# Patient Record
Sex: Male | Born: 1990 | Race: Black or African American | Hispanic: No | Marital: Single | Smoking: Former smoker
Health system: Southern US, Community
[De-identification: ages and names within clinical notes are randomized; demographics above are authoritative.]

## PROBLEM LIST (undated history)

## (undated) NOTE — *Deleted (*Deleted)
[facility] Progress Note   Subjective:   Had HD #2 yesterday.   Objective Vitals:   06/24/20 1807 06/24/20 1951 06/25/20 0502 06/25/20 0857  BP: (!) 166/101 (!) 158/106 (!) 173/98 (!) 140/94  Pulse: 90 87 83 78  Resp: 13 18 18 18   Temp: 98.4 F (36.9 C) 98.7 F (37.1 C) 99.3 F (37.4 C) 98.3 F (36.8 C)  TempSrc: Oral Oral Oral Oral  SpO2: 100% 100% 100% 98%  Weight: 74.1 kg 74.1 kg    Height:       Physical Exam General: comfortable Heart: RRR Lungs:clear Extremities: no edema Neuro: nonfocal Psych:  Normal mood and affect in circumstances Dialysis access:  RIJ TDC with large pressure dressing without e/o ongoing bleeding.  Additional Objective Labs: Basic Metabolic Panel: Recent Labs  Lab 06/21/20 1427 06/22/20 0052 06/23/20 0249 06/24/20 0603 06/25/20 0105  NA 142   < > 140 140 140  K 4.4   < > 3.7 3.4* 3.8  CL 110   < > 103 104 103  CO2 15*   < > 20* 22 28  GLUCOSE 80   < > 99 96 94  BUN 123*   < > 124* 73* 40*  CREATININE 22.42*   < > 22.61* 15.78* 11.35*  CALCIUM 6.3*  6.2   < > 6.4* 6.9* 7.1*  PHOS 10.3*  --   --   --  4.4   < > = values in this interval not displayed.   Liver Function Tests: Recent Labs  Lab 06/22/20 0052 06/22/20 0052 06/23/20 0249 06/24/20 0603 06/25/20 0105  AST 11*  --  13* 12*  --   ALT 15  --  17 13  --   ALKPHOS 28*  --  31* 28*  --   BILITOT 0.9  --  0.8 0.8  --   PROT 5.2*  --  5.8* 5.7*  --   ALBUMIN 3.3*   < > 3.5 3.5 3.1*   < > = values in this interval not displayed.   No results for input(s): LIPASE, AMYLASE in the last 168 hours. CBC: Recent Labs  Lab 06/21/20 1427 06/21/20 1427 06/22/20 0052 06/22/20 0052 06/24/20 0603 06/24/20 2218 06/25/20 0105  WBC 2.7*   < > 4.1   < > 4.2 3.5* 3.7*  NEUTROABS 1.4*  --  2.4  --  3.1  --   --   HGB 7.0*   < > 7.5*   < > 7.9* 7.3* 7.1*  HCT 20.9*   < > 22.0*   < > 23.9* 22.2* 21.5*  MCV 92.5  --  92.4  --  91.2 91.0 91.5  PLT 96*   < > 96*    < > 84* 53* 54*   < > = values in this interval not displayed.   Blood Culture No results found for: SDES, SPECREQUEST, CULT, REPTSTATUS  Cardiac Enzymes: No results for input(s): CKTOTAL, CKMB, CKMBINDEX, TROPONINI in the last 168 hours. CBG: No results for input(s): GLUCAP in the last 168 hours. Iron Studies:  No results for input(s): IRON, TIBC, TRANSFERRIN, FERRITIN in the last 72 hours. @lablastinr3 @ Studies/Results: IR Fluoro Guide CV Line Right  Result Date: 06/23/2020 INDICATION: 45 year old male with end-stage renal disease requiring dialysis. EXAM: TUNNELED PICC LINE WITH ULTRASOUND AND FLUOROSCOPIC GUIDANCE MEDICATIONS: Ancef IV. The antibiotic was given in an appropriate time interval prior to skin puncture. ANESTHESIA/SEDATION: Versed 2 mg IV; Fentanyl 75 mcg IV; Moderate Sedation Time:  23  minutes The patient was continuously monitored during the procedure by the interventional radiology nurse under my direct supervision. FLUOROSCOPY TIME:  Fluoroscopy Time: 0 minutes 42 seconds ( mGy). COMPLICATIONS: None immediate. PROCEDURE: Informed written consent was obtained from the patient after a discussion of the risks, benefits, and alternatives to treatment. Questions regarding the procedure were encouraged and answered. The right neck and chest were prepped with chlorhexidine in a sterile fashion, and a sterile drape was applied covering the operative field. Maximum barrier sterile technique with sterile gowns and gloves were used for the procedure. A timeout was performed prior to the initiation of the procedure. After creating a small venotomy incision, a micropuncture kit was utilized to access the right internal jugular vein under direct, real-time ultrasound guidance after the overlying soft tissues were anesthetized with 1% lidocaine with epinephrine. Ultrasound image documentation was performed. The microwire was kinked to measure appropriate catheter length. The micropuncture  sheath was exchanged for a peel-away sheath over a guidewire. A 14 French dual lumen tunneled dialysis catheter measuring 23 cm tip to cuff cm was tunneled in a retrograde fashion from the anterior chest wall to the venotomy incision. The catheter was then placed through the peel-away sheath with tip ultimately positioned at the superior caval-atrial junction. Final catheter positioning was confirmed and documented with a spot radiographic image. The catheter aspirates and flushes normally. The catheter was flushed with appropriate volume heparin dwells. The catheter exit site was secured with a 0-Prolene retention suture. The venotomy incision was closed with Dermabond. Dressings were applied. The patient tolerated the procedure well without immediate post procedural complication. FINDINGS: After catheter placement, the tip lies within the superior cavoatrial junction. The catheter aspirates and flushes normally and is ready for immediate use. IMPRESSION: Successful placement of 23 cm tip to cuffcm dual lumen tunneled dialysis catheter catheter via the right internal jugular vein with tip terminating at the superior caval atrial junction. The catheter is ready for immediate use. Electronically Signed   By: Constance Holster M.D.   On: 06/23/2020 18:48   IR US Guide Vasc Access Right  Result Date: 06/23/2020 INDICATION: 54 year old male with end-stage renal disease requiring dialysis. EXAM: TUNNELED PICC LINE WITH ULTRASOUND AND FLUOROSCOPIC GUIDANCE MEDICATIONS: Ancef IV. The antibiotic was given in an appropriate time interval prior to skin puncture. ANESTHESIA/SEDATION: Versed 2 mg IV; Fentanyl 75 mcg IV; Moderate Sedation Time:  23 minutes The patient was continuously monitored during the procedure by the interventional radiology nurse under my direct supervision. FLUOROSCOPY TIME:  Fluoroscopy Time: 0 minutes 42 seconds ( mGy). COMPLICATIONS: None immediate. PROCEDURE: Informed written consent was obtained  from the patient after a discussion of the risks, benefits, and alternatives to treatment. Questions regarding the procedure were encouraged and answered. The right neck and chest were prepped with chlorhexidine in a sterile fashion, and a sterile drape was applied covering the operative field. Maximum barrier sterile technique with sterile gowns and gloves were used for the procedure. A timeout was performed prior to the initiation of the procedure. After creating a small venotomy incision, a micropuncture kit was utilized to access the right internal jugular vein under direct, real-time ultrasound guidance after the overlying soft tissues were anesthetized with 1% lidocaine with epinephrine. Ultrasound image documentation was performed. The microwire was kinked to measure appropriate catheter length. The micropuncture sheath was exchanged for a peel-away sheath over a guidewire. A 14 French dual lumen tunneled dialysis catheter measuring 23 cm tip to cuff cm was tunneled in  a retrograde fashion from the anterior chest wall to the venotomy incision. The catheter was then placed through the peel-away sheath with tip ultimately positioned at the superior caval-atrial junction. Final catheter positioning was confirmed and documented with a spot radiographic image. The catheter aspirates and flushes normally. The catheter was flushed with appropriate volume heparin dwells. The catheter exit site was secured with a 0-Prolene retention suture. The venotomy incision was closed with Dermabond. Dressings were applied. The patient tolerated the procedure well without immediate post procedural complication. FINDINGS: After catheter placement, the tip lies within the superior cavoatrial junction. The catheter aspirates and flushes normally and is ready for immediate use. IMPRESSION: Successful placement of 23 cm tip to cuffcm dual lumen tunneled dialysis catheter catheter via the right internal jugular vein with tip  terminating at the superior caval atrial junction. The catheter is ready for immediate use. Electronically Signed   By: Constance Holster M.D.   On: 06/23/2020 18:48   Medications: . sodium chloride Stopped (06/21/20 0509)  . sodium chloride    . sodium chloride     . amLODipine  5 mg Oral Daily  . calcium carbonate  400 mg of elemental calcium Oral TID WC  . Chlorhexidine Gluconate Cloth  6 each Topical Q0600  . darbepoetin (ARANESP) injection - DIALYSIS  60 mcg Subcutaneous Q Sat-HD  . [START ON 07/01/2020] darbepoetin (ARANESP) injection - DIALYSIS  60 mcg Intravenous Q Sat-HD  . feeding supplement (NEPRO CARB STEADY)  237 mL Oral TID BM  . lidocaine (PF)      . loperamide  2 mg Oral Q8H  . sodium bicarbonate  650 mg Oral TID    Dialysis Orders:  Assessment/Plan: **ESRD:  I suspect this represents ESRD given labs and appearance of kidneys on [place] with diffuse echogenicity, small, somewhat atrophic appearance.  He has general serologic evaluation in process --> suspect it will be bland but will follow; less important now as this is likely ESRD but will be important for future transplant.  [place] placement 10/8 with HD #1 overnight, HD #2 today.  VVS appreciated - will place perm access, plan pending vein mapping. Receiving education re: ESRD, diet, etc.  We discussed transplant process this week.   CLIP underway.  **HTN:  Initiated amlodipine 5mg  daily.  Having some pain overnight so maintain current dose as I suspect HTN is pain related.  **Pancytopenia:  Anemia expected with CKD but not pancytopenia.  Iron ok, will initiate ESA. Follow but consider hematology input if not improving.  HIV neg.   **Metabolic acidosis: d/c po bicarb now that dialyzing.  **N/V/diarrhea:  Suspect mostly uremia; Gi pathogen panel neg.Marland Kitchen  Dispo: pending initiation of dialysis and arrangement of outpt HD.    Jannifer Hick MD 06/25/2020, 10:05 AM  [facility] Pager: [phone]

---

## 2020-06-20 ENCOUNTER — Inpatient Hospital Stay (HOSPITAL_COMMUNITY)
Admission: EM | Admit: 2020-06-20 | Discharge: 2020-06-29 | DRG: 673 | Disposition: A | Discharge status: Home / Self Care | Payer: Medicaid Other | Attending: Internal Medicine | Admitting: Internal Medicine

## 2020-06-20 ENCOUNTER — Other Ambulatory Visit: Payer: Self-pay

## 2020-06-20 ENCOUNTER — Encounter (HOSPITAL_COMMUNITY): Payer: Self-pay | Admitting: Emergency Medicine

## 2020-06-20 DIAGNOSIS — Z992 Dependence on renal dialysis: Secondary | ICD-10-CM

## 2020-06-20 DIAGNOSIS — D61818 Other pancytopenia: Secondary | ICD-10-CM

## 2020-06-20 DIAGNOSIS — D631 Anemia in chronic kidney disease: Secondary | ICD-10-CM | POA: Diagnosis present

## 2020-06-20 DIAGNOSIS — Z20822 Contact with and (suspected) exposure to covid-19: Secondary | ICD-10-CM | POA: Diagnosis present

## 2020-06-20 DIAGNOSIS — R432 Parageusia: Secondary | ICD-10-CM | POA: Diagnosis present

## 2020-06-20 DIAGNOSIS — N261 Atrophy of kidney (terminal): Secondary | ICD-10-CM | POA: Diagnosis present

## 2020-06-20 DIAGNOSIS — I120 Hypertensive chronic kidney disease with stage 5 chronic kidney disease or end stage renal disease: Secondary | ICD-10-CM | POA: Diagnosis present

## 2020-06-20 DIAGNOSIS — N19 Unspecified kidney failure: Secondary | ICD-10-CM

## 2020-06-20 DIAGNOSIS — E43 Unspecified severe protein-calorie malnutrition: Secondary | ICD-10-CM | POA: Insufficient documentation

## 2020-06-20 DIAGNOSIS — N2581 Secondary hyperparathyroidism of renal origin: Secondary | ICD-10-CM | POA: Diagnosis present

## 2020-06-20 DIAGNOSIS — E538 Deficiency of other specified B group vitamins: Secondary | ICD-10-CM | POA: Diagnosis present

## 2020-06-20 DIAGNOSIS — D649 Anemia, unspecified: Secondary | ICD-10-CM

## 2020-06-20 DIAGNOSIS — R197 Diarrhea, unspecified: Secondary | ICD-10-CM | POA: Diagnosis present

## 2020-06-20 DIAGNOSIS — E872 Acidosis, unspecified: Secondary | ICD-10-CM

## 2020-06-20 DIAGNOSIS — Z6820 Body mass index (BMI) 20.0-20.9, adult: Secondary | ICD-10-CM

## 2020-06-20 DIAGNOSIS — D696 Thrombocytopenia, unspecified: Secondary | ICD-10-CM

## 2020-06-20 DIAGNOSIS — F17210 Nicotine dependence, cigarettes, uncomplicated: Secondary | ICD-10-CM | POA: Diagnosis present

## 2020-06-20 DIAGNOSIS — N186 End stage renal disease: Secondary | ICD-10-CM

## 2020-06-20 DIAGNOSIS — N179 Acute kidney failure, unspecified: Principal | ICD-10-CM

## 2020-06-20 LAB — BASIC METABOLIC PANEL
Anion gap: 16 — ABNORMAL HIGH (ref 5–15)
BUN: 130 mg/dL — ABNORMAL HIGH (ref 6–20)
CO2: 13 mmol/L — ABNORMAL LOW (ref 22–32)
Calcium: 6.6 mg/dL — ABNORMAL LOW (ref 8.9–10.3)
Chloride: 110 mmol/L (ref 98–111)
Creatinine, Ser: 24.48 mg/dL — ABNORMAL HIGH (ref 0.61–1.24)
GFR calc non Af Amer: 2 mL/min — ABNORMAL LOW (ref >60)
Glucose, Bld: 125 mg/dL — ABNORMAL HIGH (ref 70–99)
Potassium: 4.7 mmol/L (ref 3.5–5.1)
Sodium: 139 mmol/L (ref 135–145)

## 2020-06-20 LAB — CBC
HCT: 17.5 % — ABNORMAL LOW (ref 39.0–52.0)
Hemoglobin: 5.6 g/dL — CL (ref 13.0–17.0)
MCH: 31.1 pg (ref 26.0–34.0)
MCHC: 32 g/dL (ref 30.0–36.0)
MCV: 97.2 fL (ref 80.0–100.0)
Platelets: 111 10*3/uL — ABNORMAL LOW (ref 150–400)
RBC: 1.8 MIL/uL — ABNORMAL LOW (ref 4.22–5.81)
RDW: 14.2 % (ref 11.5–15.5)
WBC: 3.3 10*3/uL — ABNORMAL LOW (ref 4.0–10.5)
nRBC: 0 % (ref 0.0–0.2)

## 2020-06-20 LAB — PREPARE RBC (CROSSMATCH)

## 2020-06-20 MED ORDER — SODIUM CHLORIDE 0.9 % IV SOLN: 10.0000 mL/h | Freq: Once | INTRAVENOUS | Status: DC

## 2020-06-20 MED ORDER — SODIUM CHLORIDE 0.9 % IV BOLUS
1000.0000 mL | Freq: Once | INTRAVENOUS | Status: AC
  Administered 2020-06-20: 1000 mL via INTRAVENOUS

## 2020-06-20 NOTE — ED Triage Notes (Addendum)
Pt presents to ED POV. Pt c/o loss of appetite and nausea. Pt expresses he is able to swallow food, just does not want to eat. Denies and other GI/GU complaints. NAD

## 2020-06-20 NOTE — ED Notes (Signed)
Pt reports having daily emesis episodes over 1 month ago, recently resolve. Emesis post fluid intake or coughing, pt reports daily nausea continues.

## 2020-06-20 NOTE — ED Provider Notes (Addendum)
[place] [facility] EMERGENCY DEPARTMENT Provider Note   CSN: [id number] Arrival date & time: 06/20/20  1900     History Chief Complaint  Patient presents with  . Anorexia    Brian Duffy. is a 29 y.o. male.  29 year old male presents with complaint of progressively worsening fatigue and loss of appetite over the past several months.  Patient states that he can tolerate small to normal drinks however if he drinks a large amount of water he will have vomiting.  He reports normal urinary output as well as normal bowel movements, denies any dark or bloody stools.  Patient does report unintentional weight loss, not quantified.  Patient does not have a PCP, has not been seen for this previously patient is an occasional smoker, does smoke marijuana as well.  Denies illicit drug use otherwise or IV drug abuse.  No significant health history, does not take any medications daily.        History reviewed. No pertinent past medical history.  Patient Active Problem List   Diagnosis Date Noted  . AKI (acute kidney injury) ([place]) 06/21/2020    History reviewed. No pertinent family history.  Social History   Tobacco Use  . Smoking status: Not on file  Substance Use Topics  . Alcohol use: Not on file  . Drug use: Not on file    Home Medications Prior to Admission medications   Not on File    Allergies    Patient has no known allergies.  Review of Systems   Review of Systems  Constitutional: Positive for appetite change, fatigue and unexpected weight change. Negative for fever.  Respiratory: Negative for shortness of breath.   Cardiovascular: Negative for chest pain.  Gastrointestinal: Negative for abdominal pain, blood in stool, constipation, diarrhea, nausea and vomiting.  Genitourinary: Negative for difficulty urinating, dysuria, frequency and urgency.  Musculoskeletal: Negative for back pain.  Skin: Negative for rash and wound.  Allergic/Immunologic: Negative  for immunocompromised state.  Neurological: Negative for dizziness, weakness and headaches.  Psychiatric/Behavioral: Negative for confusion.  All other systems reviewed and are negative.   Physical Exam Updated Vital Signs BP (!) 167/106 (BP Location: Right Arm)   Pulse 65   Temp 99 F (37.2 C) (Oral)   Resp 15   Ht 6\' 2"  (1.88 m)   Wt 74.8 kg   SpO2 100%   BMI 21.18 kg/m   Physical Exam Vitals and nursing note reviewed.  Constitutional:      General: He is not in acute distress.    Appearance: He is well-developed. He is not diaphoretic.  HENT:     Head: Normocephalic and atraumatic.  Cardiovascular:     Rate and Rhythm: Normal rate.     Pulses: Normal pulses.     Heart sounds: Normal heart sounds.  Pulmonary:     Effort: Pulmonary effort is normal.     Breath sounds: Normal breath sounds.  Abdominal:     Palpations: Abdomen is soft.     Tenderness: There is no abdominal tenderness.  Musculoskeletal:     Right lower leg: No edema.     Left lower leg: No edema.  Skin:    General: Skin is warm and dry.     Findings: No erythema or rash.  Neurological:     Mental Status: He is alert and oriented to person, place, and time.  Psychiatric:        Behavior: Behavior normal.     ED Results / Procedures /  Treatments   Labs (all labs ordered are listed, but only abnormal results are displayed) Labs Reviewed  BASIC METABOLIC PANEL - Abnormal; Notable for the following components:      Result Value   CO2 13 (*)    Glucose, Bld 125 (*)    BUN 130 (*)    Creatinine, Ser 24.48 (*)    Calcium 6.6 (*)    GFR calc non Af Amer 2 (*)    Anion gap 16 (*)    All other components within normal limits  CBC - Abnormal; Notable for the following components:   WBC 3.3 (*)    RBC 1.80 (*)    Hemoglobin 5.6 (*)    HCT 17.5 (*)    Platelets 111 (*)    All other components within normal limits  RESPIRATORY PANEL BY RT PCR (FLU A&B, COVID)  URINALYSIS, ROUTINE W REFLEX  MICROSCOPIC  I-STAT CHEM 8, ED  TYPE AND SCREEN  PREPARE RBC (CROSSMATCH)  ABO/RH    EKG EKG Interpretation  Date/Time:  Tuesday June 20 2020 22:34:26 EDT Ventricular Rate:  66 PR Interval:    QRS Duration: 102 QT Interval:  468 QTC Calculation: 491 R Axis:   60 Text Interpretation: Sinus rhythm Borderline T abnormalities, inferior leads Prolonged QT interval Confirmed by Dene Gentry [phone]) on 06/20/2020 10:42:14 PM   Radiology No results found.  Procedures .Critical Care Performed by: Tacy Learn, PA-C Authorized by: Tacy Learn, PA-C   Critical care provider statement:    Critical care time (minutes):  45   Critical care was time spent personally by me on the following activities:  Discussions with consultants, evaluation of patient's response to treatment, examination of patient, ordering and performing treatments and interventions, ordering and review of laboratory studies, ordering and review of radiographic studies, pulse oximetry, re-evaluation of patient's condition, obtaining history from patient or surrogate and review of old charts   (including critical care time)  Medications Ordered in ED Medications  0.9 %  sodium chloride infusion (has no administration in time range)  sodium bicarbonate 150 mEq in sterile water 1,000 mL infusion (has no administration in time range)  sodium chloride 0.9 % bolus 1,000 mL (0 mLs Intravenous Stopped 06/20/20 2337)    ED Course  I have reviewed the triage vital signs and the nursing notes.  Pertinent labs & imaging results that were available during my care of the patient were reviewed by me and considered in my medical decision making (see chart for details).  Clinical Course as of Jun 21 2  Tue Jun 20, 2020  2151 29 yo previously healthy male presents with complaint of gradual onset of fatigue and loss of appetite. On exam, well appearing thin male, abdomen is soft and non tender, respirations even and  unlabored.    [LM]  2153 Labs collected in triage with hgb 5.6, patient declines rectal exam at this time, declines blood transfusion at this time, states he needs more time to think about this. BMP with Cr 24 and BUN 130, Co2 12, gap 16. K normal at 4.7. Plan is to check I-stat to verify abnormal lab results, check type and screen, IV fluids and screening COVID.    [LM]  2310 I-stat confirms labs previously collected. Nephrology paged for consult, hospitalist paged for admission.    [LM]  2318 Case discussed with nephrology, Dr. Carolin Sicks, does not need dialysis tonight, will see in the morning. Requests renal US and may transfuse with 2 units  PRBC. Requests admit to medicine.   [LM]  2328 IV fluids discontinued, patient did not receive bolus.    [LM]  Wed Jun 21, 2020  0002 Sodium bicarb infusion ordered per hospitalist verbal order.   [LM]    Clinical Course User Index [LM] Roque Lias   MDM Rules/Calculators/A&P                          Final Clinical Impression(s) / ED Diagnoses Final diagnoses:  AKI (acute kidney injury) ([place])  Anemia, unspecified type    Rx / DC Orders ED Discharge Orders    None       Tacy Learn, PA-C 06/20/20 2333    Tacy Learn, PA-C 06/21/20 0003    Valarie Merino, MD 06/23/20 [phone]

## 2020-06-21 ENCOUNTER — Emergency Department (HOSPITAL_COMMUNITY): Payer: Medicaid Other

## 2020-06-21 ENCOUNTER — Other Ambulatory Visit: Payer: Self-pay

## 2020-06-21 ENCOUNTER — Encounter (HOSPITAL_COMMUNITY): Payer: Self-pay | Admitting: Internal Medicine

## 2020-06-21 DIAGNOSIS — E872 Acidosis, unspecified: Secondary | ICD-10-CM

## 2020-06-21 DIAGNOSIS — N2581 Secondary hyperparathyroidism of renal origin: Secondary | ICD-10-CM | POA: Diagnosis present

## 2020-06-21 DIAGNOSIS — N179 Acute kidney failure, unspecified: Secondary | ICD-10-CM | POA: Diagnosis present

## 2020-06-21 DIAGNOSIS — D631 Anemia in chronic kidney disease: Secondary | ICD-10-CM | POA: Diagnosis present

## 2020-06-21 DIAGNOSIS — E538 Deficiency of other specified B group vitamins: Secondary | ICD-10-CM | POA: Diagnosis present

## 2020-06-21 DIAGNOSIS — N261 Atrophy of kidney (terminal): Secondary | ICD-10-CM | POA: Diagnosis present

## 2020-06-21 DIAGNOSIS — D649 Anemia, unspecified: Secondary | ICD-10-CM

## 2020-06-21 DIAGNOSIS — Z20822 Contact with and (suspected) exposure to covid-19: Secondary | ICD-10-CM | POA: Diagnosis present

## 2020-06-21 DIAGNOSIS — N19 Unspecified kidney failure: Secondary | ICD-10-CM

## 2020-06-21 DIAGNOSIS — Z992 Dependence on renal dialysis: Secondary | ICD-10-CM | POA: Diagnosis not present

## 2020-06-21 DIAGNOSIS — I120 Hypertensive chronic kidney disease with stage 5 chronic kidney disease or end stage renal disease: Secondary | ICD-10-CM | POA: Diagnosis present

## 2020-06-21 DIAGNOSIS — E43 Unspecified severe protein-calorie malnutrition: Secondary | ICD-10-CM | POA: Diagnosis present

## 2020-06-21 DIAGNOSIS — F17210 Nicotine dependence, cigarettes, uncomplicated: Secondary | ICD-10-CM | POA: Diagnosis present

## 2020-06-21 DIAGNOSIS — D61818 Other pancytopenia: Secondary | ICD-10-CM | POA: Diagnosis present

## 2020-06-21 DIAGNOSIS — D696 Thrombocytopenia, unspecified: Secondary | ICD-10-CM

## 2020-06-21 DIAGNOSIS — N186 End stage renal disease: Secondary | ICD-10-CM | POA: Diagnosis present

## 2020-06-21 DIAGNOSIS — Z6820 Body mass index (BMI) 20.0-20.9, adult: Secondary | ICD-10-CM | POA: Diagnosis not present

## 2020-06-21 DIAGNOSIS — R432 Parageusia: Secondary | ICD-10-CM | POA: Diagnosis present

## 2020-06-21 DIAGNOSIS — R197 Diarrhea, unspecified: Secondary | ICD-10-CM | POA: Diagnosis present

## 2020-06-21 LAB — URINALYSIS, ROUTINE W REFLEX MICROSCOPIC
Bacteria, UA: NONE SEEN
Bilirubin Urine: NEGATIVE
Glucose, UA: 50 mg/dL — AB
Ketones, ur: NEGATIVE mg/dL
Leukocytes,Ua: NEGATIVE
Nitrite: NEGATIVE
Protein, ur: 100 mg/dL — AB
Specific Gravity, Urine: 1.008 (ref 1.005–1.030)
pH: 6 (ref 5.0–8.0)

## 2020-06-21 LAB — PROTEIN / CREATININE RATIO, URINE
Creatinine, Urine: 85.97 mg/dL
Protein Creatinine Ratio: 1.3 mg/mg{Cre} — ABNORMAL HIGH (ref 0.00–0.15)
Total Protein, Urine: 112 mg/dL

## 2020-06-21 LAB — CBC WITH DIFFERENTIAL/PLATELET
Abs Immature Granulocytes: 0 10*3/uL (ref 0.00–0.07)
Basophils Absolute: 0 10*3/uL (ref 0.0–0.1)
Basophils Relative: 1 %
Eosinophils Absolute: 0.3 10*3/uL (ref 0.0–0.5)
Eosinophils Relative: 12 %
HCT: 20.9 % — ABNORMAL LOW (ref 39.0–52.0)
Hemoglobin: 7 g/dL — ABNORMAL LOW (ref 13.0–17.0)
Immature Granulocytes: 0 %
Lymphocytes Relative: 29 %
Lymphs Abs: 0.8 10*3/uL (ref 0.7–4.0)
MCH: 31 pg (ref 26.0–34.0)
MCHC: 33.5 g/dL (ref 30.0–36.0)
MCV: 92.5 fL (ref 80.0–100.0)
Monocytes Absolute: 0.2 10*3/uL (ref 0.1–1.0)
Monocytes Relative: 6 %
Neutro Abs: 1.4 10*3/uL — ABNORMAL LOW (ref 1.7–7.7)
Neutrophils Relative %: 52 %
Platelets: 96 10*3/uL — ABNORMAL LOW (ref 150–400)
RBC: 2.26 MIL/uL — ABNORMAL LOW (ref 4.22–5.81)
RDW: 14.7 % (ref 11.5–15.5)
WBC: 2.7 10*3/uL — ABNORMAL LOW (ref 4.0–10.5)
nRBC: 0 % (ref 0.0–0.2)

## 2020-06-21 LAB — SODIUM, URINE, RANDOM: Sodium, Ur: 75 mmol/L

## 2020-06-21 LAB — FOLATE: Folate: 6 ng/mL (ref >5.9)

## 2020-06-21 LAB — HEPATITIS C ANTIBODY: HCV Ab: NONREACTIVE

## 2020-06-21 LAB — IRON AND TIBC
Iron: 131 ug/dL (ref 45–182)
Saturation Ratios: 74 % — ABNORMAL HIGH (ref 17.9–39.5)
TIBC: 176 ug/dL — ABNORMAL LOW (ref 250–450)
UIBC: 45 ug/dL

## 2020-06-21 LAB — RENAL FUNCTION PANEL
Albumin: 3.2 g/dL — ABNORMAL LOW (ref 3.5–5.0)
Anion gap: 17 — ABNORMAL HIGH (ref 5–15)
BUN: 123 mg/dL — ABNORMAL HIGH (ref 6–20)
CO2: 15 mmol/L — ABNORMAL LOW (ref 22–32)
Calcium: 6.3 mg/dL — CL (ref 8.9–10.3)
Chloride: 110 mmol/L (ref 98–111)
Creatinine, Ser: 22.42 mg/dL — ABNORMAL HIGH (ref 0.61–1.24)
GFR calc non Af Amer: 2 mL/min — ABNORMAL LOW (ref >60)
Glucose, Bld: 80 mg/dL (ref 70–99)
Phosphorus: 10.3 mg/dL — ABNORMAL HIGH (ref 2.5–4.6)
Potassium: 4.4 mmol/L (ref 3.5–5.1)
Sodium: 142 mmol/L (ref 135–145)

## 2020-06-21 LAB — RETICULOCYTES
Immature Retic Fract: 4 % (ref 2.3–15.9)
RBC.: 2.26 MIL/uL — ABNORMAL LOW (ref 4.22–5.81)
Retic Count, Absolute: 24 10*3/uL (ref 19.0–186.0)
Retic Ct Pct: 1.1 % (ref 0.4–3.1)

## 2020-06-21 LAB — RESPIRATORY PANEL BY RT PCR (FLU A&B, COVID)
Influenza A by PCR: NEGATIVE
Influenza B by PCR: NEGATIVE
SARS Coronavirus 2 by RT PCR: NEGATIVE

## 2020-06-21 LAB — CREATININE, URINE, RANDOM: Creatinine, Urine: 87.18 mg/dL

## 2020-06-21 LAB — ABO/RH: ABO/RH(D): A POS

## 2020-06-21 LAB — VITAMIN B12: Vitamin B-12: 277 pg/mL (ref 180–914)

## 2020-06-21 LAB — MAGNESIUM: Magnesium: 1.6 mg/dL — ABNORMAL LOW (ref 1.7–2.4)

## 2020-06-21 LAB — HEPATITIS B SURFACE ANTIGEN: Hepatitis B Surface Ag: NONREACTIVE

## 2020-06-21 LAB — FERRITIN: Ferritin: 222 ng/mL (ref 24–336)

## 2020-06-21 LAB — HIV ANTIBODY (ROUTINE TESTING W REFLEX): HIV Screen 4th Generation wRfx: NONREACTIVE

## 2020-06-21 MED ORDER — STERILE WATER FOR INJECTION IV SOLN
INTRAVENOUS | Status: DC
  Filled 2020-06-21 (×3): qty 150
  Filled 2020-06-21 (×4): qty 850

## 2020-06-21 MED ORDER — CALCIUM GLUCONATE-NACL 1-0.675 GM/50ML-% IV SOLN
1.0000 g | Freq: Once | INTRAVENOUS | Status: AC
  Administered 2020-06-21: 1000 mg via INTRAVENOUS
  Filled 2020-06-21: qty 50

## 2020-06-21 MED ORDER — HEPARIN SODIUM (PORCINE) 5000 UNIT/ML IJ SOLN
5000.0000 [IU] | Freq: Three times a day (TID) | INTRAMUSCULAR | Status: DC
  Administered 2020-06-21 – 2020-06-22 (×5): 5000 [IU] via SUBCUTANEOUS
  Filled 2020-06-21 (×4): qty 1

## 2020-06-21 NOTE — Progress Notes (Signed)
CRITICAL VALUE ALERT  Critical Value: Calcium: 6.3  Date & Time Notied: 06/21/20 1600  Provider Notified: Verlon Au, MD   Orders Received/Actions taken: Orders placed for Calcium Gluconate 1 G IVPB.

## 2020-06-21 NOTE — Plan of Care (Signed)
  Problem: Health Behavior/Discharge Planning: Goal: Ability to manage health-related needs will improve Outcome: Completed/Met   Problem: Nutrition: Goal: Adequate nutrition will be maintained Outcome: Completed/Met   Problem: Pain Managment: Goal: General experience of comfort will improve Outcome: Completed/Met

## 2020-06-21 NOTE — Progress Notes (Signed)
Patient seen and examined and agree with plan of care as per my partner who admitted this patient earlier this morning He feels somewhat better-he is seen nephrologist and understands that his kidneys are in trouble He tells me he has no inadvertent ingestions and has not taken any over-the-counter's for his diarrhea He is passing reasonable urine he is not hungry at all however  P Repeat labs in the morning Follow-up nephrology work-up-I am hopeful for renal recovery however given this degree of azotemia unclear if he was ever returned to baseline He may require if work-up is negative for renal biopsy but it may not change the need for dialysis  No charge  Verneita Griffes, MD [facility] 2:51 PM

## 2020-06-21 NOTE — H&P (Addendum)
History and Physical    Brian Duffy. FIE:[id number] DOB: 1991/02/04 DOA: 06/20/2020  PCP: Patient, [name] Patient coming from: Home  Chief Complaint: Loss of appetite  HPI: Brian Duffy. is a 29 y.o. male with no significant past medical history presenting with a chief complaint of loss of appetite. Patient reports having loss of appetite, nausea, vomiting, and fatigue for several months. States he does not feel like eating and when he tries to eat he starts feeling nauseous. He has not vomited this past week. Also experiences abdominal cramps sometimes. He is also having diarrhea. States he is urinating more than usual. Sometimes he feels short of breath. Denies history of kidney disease or any other medical problems. He does not take any medications. Denies family history of kidney disease. Denies over-the-counter NSAID use or any recent antibiotic use. He smokes 3 to 4 cigarettes a day and smokes marijuana.  ED Course: Blood pressure elevated on arrival, subsequently improved.  Remainder of vital signs stable.  WBC 3.3, hemoglobin 5.6, hematocrit 17.5, MCV 97.2, platelet 111K.  No prior labs for comparison.  FOBT could not be done as patient declined rectal exam.  Sodium 139, potassium 4.7, chloride 110, bicarb 13, BUN 130, creatinine 24.4, glucose 125, calcium 6.6, anion gap 16.  SARS-CoV-2 PCR test negative.  Influenza panel negative.  Renal ultrasound showing diffusely increased parenchymal echogenicity with several anechoic cysts; findings felt to be due to medical renal disease with possible polycystic kidneys.  Review of Systems:  All systems reviewed and apart from history of presenting illness, are negative.  History reviewed. No pertinent past medical history.  History reviewed. No pertinent surgical history.   reports that he has been smoking cigarettes. He has never used smokeless tobacco. He reports previous alcohol use. He reports current drug use. Drug: Marijuana.  No  Known Allergies  History reviewed. No pertinent family history.  Prior to Admission medications   Not on File    Physical Exam: Vitals:   06/21/20 0212 06/21/20 0227 06/21/20 0230 06/21/20 0309  BP: (!) 148/95 (!) 146/94 (!) 146/94 (!) 162/106  Pulse: 76 75 86 72  Resp: 16 14 15 14   Temp: 98.9 F (37.2 C) 98.9 F (37.2 C)    TempSrc: Oral Oral    SpO2:  100% 100% 100%  Weight:      Height:        Physical Exam Constitutional:      General: He is not in acute distress. HENT:     Head: Normocephalic and atraumatic.  Eyes:     Extraocular Movements: Extraocular movements intact.     Conjunctiva/sclera: Conjunctivae normal.  Cardiovascular:     Rate and Rhythm: Normal rate and regular rhythm.     Pulses: Normal pulses.  Pulmonary:     Effort: Pulmonary effort is normal. No respiratory distress.     Breath sounds: Normal breath sounds. No wheezing or rales.  Abdominal:     General: Bowel sounds are normal. There is no distension.     Palpations: Abdomen is soft.     Tenderness: There is no abdominal tenderness. There is no guarding or rebound.  Musculoskeletal:        General: No tenderness.     Cervical back: Normal range of motion and neck supple.     Comments: Trace pedal edema  Skin:    General: Skin is warm and dry.  Neurological:     General: No focal deficit present.  Mental Status: He is alert and oriented to person, place, and time.     Labs on Admission: I have personally reviewed following labs and imaging studies  CBC: Recent Labs  Lab 06/20/20 2000  WBC 3.3*  HGB 5.6*  HCT 17.5*  MCV 97.2  PLT 371*   Basic Metabolic Panel: Recent Labs  Lab 06/20/20 2000  NA 139  K 4.7  CL 110  CO2 13*  GLUCOSE 125*  BUN 130*  CREATININE 24.48*  CALCIUM 6.6*   GFR: Estimated Creatinine Clearance: 4.8 mL/min (A) (by C-G formula based on SCr of 24.48 mg/dL (H)). Liver Function Tests: No results for input(s): AST, ALT, ALKPHOS, BILITOT, PROT,  ALBUMIN in the last 168 hours. No results for input(s): LIPASE, AMYLASE in the last 168 hours. No results for input(s): AMMONIA in the last 168 hours. Coagulation Profile: No results for input(s): INR, PROTIME in the last 168 hours. Cardiac Enzymes: No results for input(s): CKTOTAL, CKMB, CKMBINDEX, TROPONINI in the last 168 hours. BNP (last 3 results) No results for input(s): PROBNP in the last 8760 hours. HbA1C: No results for input(s): HGBA1C in the last 72 hours. CBG: No results for input(s): GLUCAP in the last 168 hours. Lipid Profile: No results for input(s): CHOL, HDL, LDLCALC, TRIG, CHOLHDL, LDLDIRECT in the last 72 hours. Thyroid Function Tests: No results for input(s): TSH, T4TOTAL, FREET4, T3FREE, THYROIDAB in the last 72 hours. Anemia Panel: No results for input(s): VITAMINB12, FOLATE, FERRITIN, TIBC, IRON, RETICCTPCT in the last 72 hours. Urine analysis:    Component Value Date/Time   COLORURINE STRAW (A) 06/20/2020 2341   APPEARANCEUR CLEAR 06/20/2020 2341   LABSPEC 1.008 06/20/2020 2341   PHURINE 6.0 06/20/2020 2341   GLUCOSEU 50 (A) 06/20/2020 2341   HGBUR SMALL (A) 06/20/2020 2341   BILIRUBINUR NEGATIVE 06/20/2020 2341   KETONESUR NEGATIVE 06/20/2020 2341   PROTEINUR 100 (A) 06/20/2020 2341   NITRITE NEGATIVE 06/20/2020 2341   LEUKOCYTESUR NEGATIVE 06/20/2020 2341    Radiological Exams on Admission: US Renal  Result Date: 06/21/2020 CLINICAL DATA:  Acute kidney injury EXAM: RENAL / URINARY TRACT ULTRASOUND COMPLETE COMPARISON:  None. FINDINGS: Right Kidney: Renal measurements: 8.7 x 3.6 x 4.7 cm = volume: 77 mL. There is diffusely increased echogenicity with atrophic appearance. Several anechoic cysts are seen within the right kidney the largest measuring 2.8 x 2.1 x 2.6 cm. Left Kidney: Renal measurements: 8.8 x 5.7 x 5.3 cm = volume: 140 mL. Diffusely increased echogenicity with a slightly atrophic appearance. Several anechoic cysts are seen within the left  kidney measuring 3.2 x 2.0 x 2.8 cm Bladder: Appears normal for degree of bladder distention. Other: None. IMPRESSION: Diffusely increased parenchymal echogenicity with several anechoic cyst. This could be due to medical renal disease with possible polycystic kidneys. Electronically Signed   By: Prudencio Pair M.D.   On: 06/21/2020 00:45    EKG: Independently reviewed.  Sinus rhythm, nonspecific T wave abnormality, QTC 491.  No prior tracing for comparison.  Assessment/Plan Principal Problem:   AKI (acute kidney injury) ([place]) Active Problems:   Uremia   Metabolic acidosis   Normocytic anemia   Thrombocytopenia ([facility])   AKI Uremia Metabolic acidosis Patient is presenting with complaints of loss of appetite, nausea, and fatigue over the past several months.  No documented history of any medical problems.  Not on any nephrotoxic medications.  Symptoms likely related to uremia.  BUN 130, creatinine 24.4, GFR 2.  Potassium 4.7.  Bicarb 13.  No prior  labs for comparison.  Patient reports adequate urine output, not oliguric. He has trace pedal edema on exam but lungs clear and no signs of respiratory distress. Renal ultrasound without findings to indicate obstructive uropathy.  Showing diffusely increased parenchymal echogenicity with several anechoic cysts; findings felt to be due to medical renal disease with possible polycystic kidneys.  UA showing pH 6.0, specific gravity 1.008, small amount of proteinuria. -Discussed with nephrology.  No plan for urgent dialysis at this time.  Recommending IV fluid hydration and routine AKI work-up. Bicarb infusion ordered.  Monitor renal function and urine output very closely.  Repeat renal function panel in a.m. Do not give any nephrotoxic agents/contrast.  Check urine sodium, creatinine.  Normocytic anemia: Hemoglobin 5.6, MCV 97.2.  No prior labs for comparison.  FOBT could not be done as patient declined rectal exam. Not endorsing any symptoms of GI bleed. Anemia  likely related to renal disease. -2 units PRBCs ordered in the ED, posttransfusion H&H, check anemia panel  Thrombocytopenia: Platelet count 111K.  No prior labs for comparison.  Likely related to renal disease.  No signs of active bleeding. -Continue to monitor  ?Hypocalcemia: Calcium 6.6. -Check albumin level  Leukopenia: WBC count 3.3.  No prior labs for comparison. -Repeat CBC with differential  Diarrhea: No fever or leukocytosis. Abdominal exam benign. -GI pathogen panel, enteric precautions  Borderline QT prolongation on EKG -Cardiac monitoring.  Monitor potassium and magnesium levels.  Repeat EKG in a.m.  Tobacco use -Patient declined nicotine patch  HIV screening The patient falls between the ages of 13-64 and should be screened for HIV, therefore HIV testing ordered.  DVT prophylaxis: Subcutaneous heparin Code Status: Full code Family Communication: No family available this time. Disposition Plan: Status is: Inpatient  Remains inpatient appropriate because:IV treatments appropriate due to intensity of illness or inability to take PO and Inpatient level of care appropriate due to severity of illness   Dispo: The patient is from: Home              Anticipated d/c is to: Home              Anticipated d/c date is: 3 days              Patient currently is not medically stable to d/c.  The medical decision making on this patient was of high complexity and the patient is at high risk for clinical deterioration, therefore this is a level 3 visit.  Shela Leff MD [facility]  If 7PM-7AM, please contact night-coverage [email or web address]  06/21/2020, 3:11 AM

## 2020-06-21 NOTE — Progress Notes (Signed)
PROGRESS NOTE    Brian Duffy.  BPZ:[id number] DOB: [birth year] DOA: 06/20/2020 PCP: Patient, [name]  Brief Narrative:  29 year old male with no past medical history other than severe loss of appetite nausea came to emergency room Found to have severe azotemia/creatinine 130/24 CO2 13 gap 16 Hemoglobin 5.6 WBC 3 platelet 111 Transfused 2 units PRBC  Work-up performed by renal includes   [-] hep C hep B, ANA, GBM membrane  Assessment & Plan:   Principal Problem:   AKI (acute kidney injury) ([place]) Active Problems:   Uremia   Metabolic acidosis   Normocytic anemia   Thrombocytopenia ([place])   1. Severe azotemia with metabolic acidosis probably from renal failure a. Patient has nausea and vomiting probably from azotemia b. He has been counseled quite in detail by Dr. Johnney Ou who is recommended possible initiation of dialysis and he is contemplating this c. Bicarb has been changed to p.o. replacement d. Appreciate nephrology input 2. Severe anemia probably secondary to impending ESRD a. Hemoccult never obtained but unlikely to be GI bleed as he is not having any actual diarrhea b. Iron stores are actually paradoxically high 3. Nausea vomiting anorexia a. From renal issues 4. BMI 21  DVT prophylaxis: Lovenox Code Status: Full presumed Family Communication: None at bedside Disposition:   Status is: Inpatient  Remains inpatient appropriate because:Hemodynamically unstable, Persistent severe electrolyte disturbances, Unsafe d/c plan and IV treatments appropriate due to intensity of illness or inability to take PO   Dispo: The patient is from: Home              Anticipated d/c is to: Home              Anticipated d/c date is: > 3 days              Patient currently is not medically stable to d/c.       Consultants:   Nephrology  Procedures: None  Antimicrobials: None   Subjective: Nausea vomiting persisting no new issues otherwise was going to leave AMA but  seems to reconsider this after discussion with myself and nephrologist  Objective: Vitals:   06/21/20 0227 06/21/20 0230 06/21/20 0309 06/21/20 0347  BP: (!) 146/94 (!) 146/94 (!) 162/106 (!) 171/113  Pulse: 75 86 72 70  Resp: 14 15 14 20   Temp: 98.9 F (37.2 C)   98.7 F (37.1 C)  TempSrc: Oral     SpO2: 100% 100% 100% 100%  Weight:      Height:        Intake/Output Summary (Last 24 hours) at 06/21/2020 0725 Last data filed at 06/21/2020 0600 Gross per 24 hour  Intake 756.67 ml  Output 0 ml  Net 756.67 ml   Filed Weights   06/20/20 2302  Weight: 74.8 kg    Examination:  General exam: EOMI NCAT looks about stated age no distress Respiratory system: Clear no added sound rales rhonchi Cardiovascular system: S1-S2 no murmur rub or gallop Gastrointestinal system: Soft nontender no rebound no guarding. Central nervous system: Neurologically intact no focal deficit Extremities: No lower extremity edema ROM intact to joints Skin: No edema Psychiatry: Euthymic  Data Reviewed: I have personally reviewed following labs and imaging studies BUNs/creatinine stable 124/22 Anion gap 18 Bicarb 15 Hemoglobin 7.5 Platelet 96  Radiology Studies: US Renal  Result Date: 06/21/2020 CLINICAL DATA:  Acute kidney injury EXAM: RENAL / URINARY TRACT ULTRASOUND COMPLETE COMPARISON:  None. FINDINGS: Right Kidney: Renal measurements: 8.7 x 3.6 x  4.7 cm = volume: 77 mL. There is diffusely increased echogenicity with atrophic appearance. Several anechoic cysts are seen within the right kidney the largest measuring 2.8 x 2.1 x 2.6 cm. Left Kidney: Renal measurements: 8.8 x 5.7 x 5.3 cm = volume: 140 mL. Diffusely increased echogenicity with a slightly atrophic appearance. Several anechoic cysts are seen within the left kidney measuring 3.2 x 2.0 x 2.8 cm Bladder: Appears normal for degree of bladder distention. Other: None. IMPRESSION: Diffusely increased parenchymal echogenicity with several anechoic  cyst. This could be due to medical renal disease with possible polycystic kidneys. Electronically Signed   By: Prudencio Pair M.D.   On: 06/21/2020 00:45     Scheduled Meds:  heparin  5,000 Units Subcutaneous Q8H   Continuous Infusions:  sodium chloride Stopped (06/21/20 0509)    sodium bicarbonate (isotonic) infusion in sterile water 125 mL/hr at 06/21/20 0504     LOS: 0 days    Time spent: 20  Nita Sells, MD [facility] To contact the attending provider between 7A-7P or the covering provider during after hours 7P-7A, please log into the web site [email or web address] and access using universal [place] password for that web site. If you do not have the password, please call the hospital operator.  06/21/2020, 7:25 AM

## 2020-06-21 NOTE — Consult Note (Signed)
[facility] Nephrology Consultation Note  Requesting MD: Dr Shela Leff Reason for consult: Renal failure  HPI:  Brian Duffy. is a 29 y.o. male no known past medical history presented with nausea, loss of appetite and weakness ongoing for more than a month seen as a consultation for further evaluation of elevated serum BUN and creatinine level.  The labs showed BUN 130, creatinine 24.48, CO2 13, potassium 4.7.  No previous lab results available to compare.  He also has pancytopenia with hemoglobin 5.6, platelet 111.  Patient reports chronic ongoing nausea associated with diarrhea and occasional abdominal cramp.  He does not take any prescription medication or OTC.  No use of NSAIDs.  No history of diabetes, hypertension or heart disease.  Smokes cigarettes and occasional marijuana. He feels like he was urinating more often.  Denies dysuria, urgency, frequency, hematuria, frothy urine, pelvic or flank pain.  No joint pain or skin rash. Influenza and Covid test negative.  He received 2 units of blood transfusion.  Blood pressure was initially elevated but fluctuating.  Oxygenation is acceptable.  The urinalysis showed clear urine with protein 100, no hematuria.  The kidney ultrasound revealed bilateral echogenic atrophied kidneys and several cysts without obstruction. He denies family history of kidney disease.  Not working.  Staying with his girlfriend. He is currently on sodium bicarbonate IV fluid.   Creatinine, Ser  Date/Time Value Ref Range Status  06/20/2020 08:00 PM 24.48 (H) 0.61 - 1.24 mg/dL Final     PMHx:  History reviewed. No pertinent past medical history.  History reviewed. No pertinent surgical history.  Family Hx: History reviewed. No pertinent family history.  Social History:  reports that he has been smoking cigarettes. He has never used smokeless tobacco. He reports previous alcohol use. He reports current drug use. Drug: Marijuana.  Allergies:  No Known Allergies  Medications: Prior to Admission medications   Not on File    I have reviewed the patient's current medications.  Labs:  Results for orders placed or performed during the hospital encounter of 06/20/20 (from the past 48 hour(s))  Basic metabolic panel     Status: Abnormal   Collection Time: 06/20/20  8:00 PM  Result Value Ref Range   Sodium 139 135 - 145 mmol/L   Potassium 4.7 3.5 - 5.1 mmol/L   Chloride 110 98 - 111 mmol/L   CO2 13 (L) 22 - 32 mmol/L   Glucose, Bld 125 (H) 70 - 99 mg/dL    Comment: Glucose reference range applies only to samples taken after fasting for at least 8 hours.   BUN 130 (H) 6 - 20 mg/dL   Creatinine, Ser 24.48 (H) 0.61 - 1.24 mg/dL   Calcium 6.6 (L) 8.9 - 10.3 mg/dL   GFR calc non Af Amer 2 (L) >60 mL/min   Anion gap 16 (H) 5 - 15    Comment: Performed at [facility] [address], [place] [address]  CBC     Status: Abnormal   Collection Time: 06/20/20  8:00 PM  Result Value Ref Range   WBC 3.3 (L) 4.0 - 10.5 K/uL   RBC 1.80 (L) 4.22 - 5.81 MIL/uL   Hemoglobin 5.6 (LL) 13.0 - 17.0 g/dL    Comment: REPEATED TO VERIFY THIS CRITICAL RESULT HAS VERIFIED AND BEEN CALLED TO RN MELANIE FLORES BY [place] ON 10 05 2021 AT 2053, AND HAS BEEN READ BACK.     HCT 17.5 (L) 39 -  52 %   MCV 97.2 80.0 - 100.0 fL   MCH 31.1 26.0 - 34.0 pg   MCHC 32.0 30.0 - 36.0 g/dL   RDW 14.2 11.5 - 15.5 %   Platelets 111 (L) 150 - 400 K/uL    Comment: REPEATED TO VERIFY PLATELET COUNT CONFIRMED BY SMEAR SPECIMEN CHECKED FOR CLOTS Immature Platelet Fraction may be clinically indicated, consider ordering this additional test [id number]    nRBC 0.0 0.0 - 0.2 %    Comment: Performed at [facility], [place] [address], [place], [place] [address]  ABO/Rh     Status: None   Collection Time: 06/20/20  8:00 PM  Result Value Ref Range   ABO/RH(D)      A POS Performed at [facility], [place] [address], [place], [place]  [address]   Type and screen [place]     Status: None (Preliminary result)   Collection Time: 06/20/20 10:50 PM  Result Value Ref Range   ABO/RH(D) A POS    Antibody Screen NEG    Sample Expiration 06/23/2020,2359    Unit Number [id number]    Blood Component Type RED CELLS,LR    Unit division 00    Status of Unit ALLOCATED    Transfusion Status OK TO TRANSFUSE    Crossmatch Result Compatible    Unit Number [id number]    Blood Component Type RED CELLS,LR    Unit division 00    Status of Unit ISSUED    Transfusion Status OK TO TRANSFUSE    Crossmatch Result      Compatible Performed at [facility], [place] [address] [place], [place] [address]   Respiratory Panel by RT PCR (Flu A&B, Covid) - Nasopharyngeal Swab     Status: None   Collection Time: 06/20/20 11:03 PM   Specimen: Nasopharyngeal Swab  Result Value Ref Range   SARS Coronavirus 2 by RT PCR NEGATIVE NEGATIVE    Comment: (NOTE) SARS-CoV-2 target nucleic acids are NOT DETECTED.  The SARS-CoV-2 RNA is generally detectable in upper respiratoy specimens during the acute phase of infection. The lowest concentration of SARS-CoV-2 viral copies this assay can detect is 131 copies/mL. A negative result does not preclude SARS-Cov-2 infection and should not be used as the sole basis for treatment or other patient management decisions. A negative result may occur with  improper specimen collection/handling, submission of specimen other than nasopharyngeal swab, presence of viral mutation(s) within the areas targeted by this assay, and inadequate number of viral copies (<131 copies/mL). A negative result must be combined with clinical observations, patient history, and epidemiological information. The expected result is Negative.  Fact Sheet for Patients:  PinkCheek.be  Fact Sheet for Healthcare Providers:  GravelBags.it  This test is no t  yet approved or cleared by the [place] FDA and  has been authorized for detection and/or diagnosis of SARS-CoV-2 by FDA under an Emergency Use Authorization (EUA). This EUA will remain  in effect (meaning this test can be used) for the duration of the COVID-19 declaration under Section 564(b)(1) of the Act, 21 U.S.C. section 360bbb-3(b)(1), unless the authorization is terminated or revoked sooner.     Influenza A by PCR NEGATIVE NEGATIVE   Influenza B by PCR NEGATIVE NEGATIVE    Comment: (NOTE) The Xpert Xpress SARS-CoV-2/FLU/RSV assay is intended as an aid in  the diagnosis of influenza from Nasopharyngeal swab specimens and  should not be used as a sole basis for treatment.  Nasal washings and  aspirates are unacceptable for Xpert Xpress SARS-CoV-2/FLU/RSV  testing.  Fact Sheet for Patients: PinkCheek.be  Fact Sheet for Healthcare Providers: GravelBags.it  This test is not yet approved or cleared by the [place] FDA and  has been authorized for detection and/or diagnosis of SARS-CoV-2 by  FDA under an Emergency Use Authorization (EUA). This EUA will remain  in effect (meaning this test can be used) for the duration of the  Covid-19 declaration under Section 564(b)(1) of the Act, 21  U.S.C. section 360bbb-3(b)(1), unless the authorization is  terminated or revoked. Performed at [facility], [place] [address] [place], [place] [address]   Prepare RBC (crossmatch)     Status: None   Collection Time: 06/20/20 11:24 PM  Result Value Ref Range   Order Confirmation      ORDER PROCESSED BY BLOOD BANK Performed at [facility], [place] [address], [place], [place] [address]   Urinalysis, Routine w reflex microscopic Urine, Clean Catch     Status: Abnormal   Collection Time: 06/20/20 11:41 PM  Result Value Ref Range   Color, Urine STRAW (A) YELLOW   APPearance CLEAR CLEAR   Specific Gravity, Urine 1.008  1.005 - 1.030   pH 6.0 5.0 - 8.0   Glucose, UA 50 (A) NEGATIVE mg/dL   Hgb urine dipstick SMALL (A) NEGATIVE   Bilirubin Urine NEGATIVE NEGATIVE   Ketones, ur NEGATIVE NEGATIVE mg/dL   Protein, ur 100 (A) NEGATIVE mg/dL   Nitrite NEGATIVE NEGATIVE   Leukocytes,Ua NEGATIVE NEGATIVE   RBC / HPF 0-5 0 - 5 RBC/hpf   WBC, UA 0-5 0 - 5 WBC/hpf   Bacteria, UA NONE SEEN NONE SEEN   Mucus PRESENT     Comment: Performed at [facility] [address], [place], [place] [address]     ROS:  Pertinent items noted in HPI and remainder of comprehensive ROS otherwise negative.  Physical Exam: Vitals:   06/21/20 0309 06/21/20 0347  BP: (!) 162/106 (!) 171/113  Pulse: 72 70  Resp: 14 20  Temp:  98.7 F (37.1 C)  SpO2: 100% 100%     General exam: Appears calm and comfortable  Respiratory system: Clear to auscultation. Respiratory effort normal. No wheezing or crackle Cardiovascular system: S1 & S2 heard, RRR.  No pedal edema. Gastrointestinal system: Abdomen is nondistended, soft and nontender. Normal bowel sounds heard. Central nervous system: Alert and oriented. No focal neurological deficits.  No asterixis. Extremities: Symmetric 5 x 5 power. Skin: No rashes, lesions or ulcers Psychiatry: Judgement and insight appear normal. Mood & affect appropriate.   Assessment/Plan:  #Advanced renal failure probably progressive CKD with underlying component of AKI related with GI loss and decreased oral intake.  No previous lab results available to compare.  UA with some protein without significant hematuria.  The kidney ultrasound showed bilateral echogenic/atrophic kidneys and multiple cysts.   Given thrombocytopenia we will send out CBC with differentials and check schistocytes. Quantify proteinuria with spot urine PCR and order basic serologies to complete the work-up including ANA, ANCA, anti-GBM, C3, C4, hep B, C, HIV. Patient reports increased urine output, strict ins and out. Follow-up  morning lab results.   No urgent need for dialysis today but it may be coming soon if no significant improvement.  #Anion gap metabolic acidosis: Currently on sodium bicarbonate.  Follow-up lab results.  #Nausea, diarrhea and abdominal pain: He probably has some component of uremia but does not explain  diarrhea and intermittent abdominal pain.  Recommend further evaluation per primary team.  #Anemia/pancytopenia: Received 2 units of blood transfusion.  Follow-up repeat lab.  Checking iron studies.  Sending basic work-up including B51, folic acid, iron studies.  May need hematology consult.  #Hypocalcemia: Start oral calcium.  Checking PTH level and phosphorus.  Thank you for the consult.  We will follow with you.   Breauna Mazzeo Tanna Furry 06/21/2020, 6:25 AM  [facility].

## 2020-06-21 NOTE — ED Notes (Signed)
Patient transported to Ultrasound 

## 2020-06-21 NOTE — Plan of Care (Signed)
  Problem: Activity: Goal: Risk for activity intolerance will decrease Outcome: Completed/Met

## 2020-06-21 NOTE — Progress Notes (Signed)
Report received from ER RN . Room ready.

## 2020-06-21 NOTE — Progress Notes (Signed)
New Admission Note:  Arrival Method: Stretcher Mental Orientation: Alert and oriented x 4 Telemetry: Box 9 NSR Assessment: Completed Skin: warm and dry IV: NSL's Pain: Denies  Tubes: N/A Safety Measures: Safety Fall Prevention Plan initiated.  Admission: Completed 5 M  Orientation: Patient has been orientated to the room, unit and the staff. Welcome booklet given.  Family: None  Orders have been reviewed and implemented. Will continue to monitor the patient. Call light has been placed within reach and bed alarm has been activated.   Sima Matas BSN, RN  Phone Number: [phone]

## 2020-06-22 LAB — TYPE AND SCREEN
ABO/RH(D): A POS
Antibody Screen: NEGATIVE
Unit division: 0
Unit division: 0

## 2020-06-22 LAB — CBC WITH DIFFERENTIAL/PLATELET
Abs Immature Granulocytes: 0.01 10*3/uL (ref 0.00–0.07)
Basophils Absolute: 0 10*3/uL (ref 0.0–0.1)
Basophils Relative: 1 %
Eosinophils Absolute: 0.3 10*3/uL (ref 0.0–0.5)
Eosinophils Relative: 8 %
HCT: 22 % — ABNORMAL LOW (ref 39.0–52.0)
Hemoglobin: 7.5 g/dL — ABNORMAL LOW (ref 13.0–17.0)
Immature Granulocytes: 0 %
Lymphocytes Relative: 27 %
Lymphs Abs: 1.1 10*3/uL (ref 0.7–4.0)
MCH: 31.5 pg (ref 26.0–34.0)
MCHC: 34.1 g/dL (ref 30.0–36.0)
MCV: 92.4 fL (ref 80.0–100.0)
Monocytes Absolute: 0.2 10*3/uL (ref 0.1–1.0)
Monocytes Relative: 5 %
Neutro Abs: 2.4 10*3/uL (ref 1.7–7.7)
Neutrophils Relative %: 59 %
Platelets: 96 10*3/uL — ABNORMAL LOW (ref 150–400)
RBC: 2.38 MIL/uL — ABNORMAL LOW (ref 4.22–5.81)
RDW: 14.6 % (ref 11.5–15.5)
WBC: 4.1 10*3/uL (ref 4.0–10.5)
nRBC: 0 % (ref 0.0–0.2)

## 2020-06-22 LAB — BPAM RBC
Blood Product Expiration Date: 202110282359
Blood Product Expiration Date: 202110292359
ISSUE DATE / TIME: 202110060203
ISSUE DATE / TIME: 202110060827
Unit Type and Rh: 6200
Unit Type and Rh: 6200

## 2020-06-22 LAB — COMPREHENSIVE METABOLIC PANEL
ALT: 15 U/L (ref 0–44)
AST: 11 U/L — ABNORMAL LOW (ref 15–41)
Albumin: 3.3 g/dL — ABNORMAL LOW (ref 3.5–5.0)
Alkaline Phosphatase: 28 U/L — ABNORMAL LOW (ref 38–126)
Anion gap: 18 — ABNORMAL HIGH (ref 5–15)
BUN: 124 mg/dL — ABNORMAL HIGH (ref 6–20)
CO2: 15 mmol/L — ABNORMAL LOW (ref 22–32)
Calcium: 6.6 mg/dL — ABNORMAL LOW (ref 8.9–10.3)
Chloride: 108 mmol/L (ref 98–111)
Creatinine, Ser: 22.27 mg/dL — ABNORMAL HIGH (ref 0.61–1.24)
GFR calc non Af Amer: 2 mL/min — ABNORMAL LOW (ref >60)
Glucose, Bld: 79 mg/dL (ref 70–99)
Potassium: 4.1 mmol/L (ref 3.5–5.1)
Sodium: 141 mmol/L (ref 135–145)
Total Bilirubin: 0.9 mg/dL (ref 0.3–1.2)
Total Protein: 5.2 g/dL — ABNORMAL LOW (ref 6.5–8.1)

## 2020-06-22 LAB — GLOMERULAR BASEMENT MEMBRANE ANTIBODIES: GBM Ab: 3 units (ref 0–20)

## 2020-06-22 LAB — ANCA TITERS
Atypical P-ANCA titer: <1:20 {titer}
C-ANCA: <1:20 {titer}
P-ANCA: <1:20 {titer}

## 2020-06-22 LAB — C4 COMPLEMENT: Complement C4, Body Fluid: 22 mg/dL (ref 12–38)

## 2020-06-22 LAB — PTH, INTACT AND CALCIUM
Calcium, Total (PTH): 6.2 mg/dL (ref 8.7–10.2)
PTH: 479 pg/mL — ABNORMAL HIGH (ref 15–65)

## 2020-06-22 LAB — C3 COMPLEMENT: C3 Complement: 66 mg/dL — ABNORMAL LOW (ref 82–167)

## 2020-06-22 LAB — ANA W/REFLEX IF POSITIVE: Anti Nuclear Antibody (ANA): NEGATIVE

## 2020-06-22 MED ORDER — AMLODIPINE BESYLATE 5 MG PO TABS
5.0000 mg | ORAL_TABLET | Freq: Every day | ORAL | Status: DC
  Administered 2020-06-22 – 2020-06-27 (×6): 5 mg via ORAL
  Filled 2020-06-22 (×7): qty 1

## 2020-06-22 MED ORDER — SODIUM BICARBONATE 650 MG PO TABS
650.0000 mg | ORAL_TABLET | Freq: Three times a day (TID) | ORAL | Status: DC
  Administered 2020-06-22 – 2020-06-24 (×8): 650 mg via ORAL
  Filled 2020-06-22 (×9): qty 1

## 2020-06-22 NOTE — Progress Notes (Signed)
[facility] Progress Note   Subjective:   UOP 2.6L, net +1.2L.  Continues to feel poorly with dysgeusia and nausea.    Objective Vitals:   06/21/20 0906 06/21/20 1300 06/21/20 2114 06/22/20 0518  BP: (!) 148/104 (!) 167/107 (!) 164/100 (!) 164/100  Pulse: 73 70 72 72  Resp: 20 18 18 18   Temp: 98.5 F (36.9 C)  98.9 F (37.2 C) 98.9 F (37.2 C)  TempSrc: Oral Oral Oral Oral  SpO2: 100% 100% 100% 100%  Weight:   74.9 kg   Height:       Physical Exam General: comfortable Heart: RRR Lungs:clear Extremities: no edema Neuro: nonfocal Psych:  Normal mood and affect in circumstances  Additional Objective Labs: Basic Metabolic Panel: Recent Labs  Lab 06/20/20 2000 06/21/20 1427 06/22/20 0052  NA 139 142 141  K 4.7 4.4 4.1  CL 110 110 108  CO2 13* 15* 15*  GLUCOSE 125* 80 79  BUN 130* 123* 124*  CREATININE 24.48* 22.42* 22.27*  CALCIUM 6.6* 6.3* 6.6*  PHOS  --  10.3*  --    Liver Function Tests: Recent Labs  Lab 06/21/20 1427 06/22/20 0052  AST  --  11*  ALT  --  15  ALKPHOS  --  28*  BILITOT  --  0.9  PROT  --  5.2*  ALBUMIN 3.2* 3.3*   No results for input(s): LIPASE, AMYLASE in the last 168 hours. CBC: Recent Labs  Lab 06/20/20 2000 06/21/20 1427 06/22/20 0052  WBC 3.3* 2.7* 4.1  NEUTROABS  --  1.4* 2.4  HGB 5.6* 7.0* 7.5*  HCT 17.5* 20.9* 22.0*  MCV 97.2 92.5 92.4  PLT 111* 96* 96*   Blood Culture No results found for: SDES, SPECREQUEST, CULT, REPTSTATUS  Cardiac Enzymes: No results for input(s): CKTOTAL, CKMB, CKMBINDEX, TROPONINI in the last 168 hours. CBG: No results for input(s): GLUCAP in the last 168 hours. Iron Studies:  Recent Labs    06/21/20 1427  IRON 131  TIBC 176*  FERRITIN 222   @[email or web address] @ Studies/Results: US Renal  Result Date: 06/21/2020 CLINICAL DATA:  Acute kidney injury EXAM: RENAL / URINARY TRACT ULTRASOUND COMPLETE COMPARISON:  None. FINDINGS: Right Kidney: Renal measurements: 8.7 x 3.6 x 4.7  cm = volume: 77 mL. There is diffusely increased echogenicity with atrophic appearance. Several anechoic cysts are seen within the right kidney the largest measuring 2.8 x 2.1 x 2.6 cm. Left Kidney: Renal measurements: 8.8 x 5.7 x 5.3 cm = volume: 140 mL. Diffusely increased echogenicity with a slightly atrophic appearance. Several anechoic cysts are seen within the left kidney measuring 3.2 x 2.0 x 2.8 cm Bladder: Appears normal for degree of bladder distention. Other: None. IMPRESSION: Diffusely increased parenchymal echogenicity with several anechoic cyst. This could be due to medical renal disease with possible polycystic kidneys. Electronically Signed   By: Prudencio Pair M.D.   On: 06/21/2020 00:45   Medications: . sodium chloride Stopped (06/21/20 0509)  .  sodium bicarbonate (isotonic) infusion in sterile water 125 mL/hr at 06/22/20 0224   . heparin  5,000 Units Subcutaneous Q8H    Dialysis Orders:  Assessment/Plan: **CKD v ESRD:  I suspect this represents ESRD given labs and appearance of kidneys on [place] with diffuse echogenicity, small, somewhat atrophic appearance.  He has general serologic evaluation in process --> suspect it will be bland but will follow; less important now as this is likely ESRD but will be important for future transplant. We had a very  long discussion about the findings in context of his symptoms and I recommend starting dialysis.  We discussed the timeline for initiation of dialysis and placement in outpt unit being 1 week (sometimes up to 2wks) realistically speaking (confirmed with our outpt dialysis coordinator).  He is very reluctant to commit to dialysis and has not given consent to do so.  He is considering his options.  Should he choose to sign out AMA he will need to return through the ED again.  I have strongly encouraged him to remain inpatient and have these issues addressed.  **HTN:  Initiate amlodipine 5mg  daily.   **Pancytopenia:  Anemia expected with CKD  but not pancytopenia.  Iron ok, will initiate ESA if remains admitted. Follow but consider hematology input if not improving.  HIV neg.   **Metabolic acidosis: start po bicarb.  D/c bicarb gtt.  **N/V/diarrhea:  Suspect mostly uremia but GI pathogen panel pending.  Should patient choose to stay inpatient I will continue discussion with him tomorrow AM -- he is aware.    Jannifer Hick MD 06/22/2020, 7:28 AM  [facility] Pager: [phone]

## 2020-06-22 NOTE — Progress Notes (Addendum)
Rounded on patient today in correlation to transition to need for HD. The patient expressed concerns about what to expect when having dialysis. We spoke in great depth about dialysis catheters vs. fistula vs graft. How dialysis cleans the blood and lifestyle changes. Ordered consult to dietician and Kidney Failure Book. Spoke with patient at great length about the role fluid restriction will now play in his health regimen along with diet.  Patient was knowledgeable and capable of verbalizing via teach back method and agreeable to start HD. Admits he was hesitant prior because he wanted to help his girlfriend move first. Also educated patient on services are available to him through the interdisciplinary team in the clinic setting. Patient inquiring about the process initiation if he has a living willing donor. Educated patient this is a better question for the nephrologist but I will follow up. Handouts and contact information provided to patient for any further assistance. Will follow as appropriate.   Dorthey Sawyer, RN  Dialysis Nurse Coordinator Phone: [phone]

## 2020-06-22 NOTE — Plan of Care (Signed)
  Problem: Education: Goal: Knowledge of General Education information will improve Description: Including pain rating scale, medication(s)/side effects and non-pharmacologic comfort measures Outcome: Progressing   Problem: Coping: Goal: Level of anxiety will decrease Outcome: Progressing   

## 2020-06-23 ENCOUNTER — Encounter (HOSPITAL_COMMUNITY): Payer: Self-pay | Admitting: Internal Medicine

## 2020-06-23 ENCOUNTER — Inpatient Hospital Stay (HOSPITAL_COMMUNITY): Payer: Medicaid Other

## 2020-06-23 DIAGNOSIS — N189 Chronic kidney disease, unspecified: Secondary | ICD-10-CM

## 2020-06-23 HISTORY — PX: IR US GUIDE VASC ACCESS RIGHT: IMG2390

## 2020-06-23 HISTORY — PX: IR FLUORO GUIDE CV LINE RIGHT: IMG2283

## 2020-06-23 LAB — COMPREHENSIVE METABOLIC PANEL
ALT: 17 U/L (ref 0–44)
AST: 13 U/L — ABNORMAL LOW (ref 15–41)
Albumin: 3.5 g/dL (ref 3.5–5.0)
Alkaline Phosphatase: 31 U/L — ABNORMAL LOW (ref 38–126)
Anion gap: 17 — ABNORMAL HIGH (ref 5–15)
BUN: 124 mg/dL — ABNORMAL HIGH (ref 6–20)
CO2: 20 mmol/L — ABNORMAL LOW (ref 22–32)
Calcium: 6.4 mg/dL — CL (ref 8.9–10.3)
Chloride: 103 mmol/L (ref 98–111)
Creatinine, Ser: 22.61 mg/dL — ABNORMAL HIGH (ref 0.61–1.24)
GFR calc non Af Amer: 2 mL/min — ABNORMAL LOW (ref >60)
Glucose, Bld: 99 mg/dL (ref 70–99)
Potassium: 3.7 mmol/L (ref 3.5–5.1)
Sodium: 140 mmol/L (ref 135–145)
Total Bilirubin: 0.8 mg/dL (ref 0.3–1.2)
Total Protein: 5.8 g/dL — ABNORMAL LOW (ref 6.5–8.1)

## 2020-06-23 LAB — GASTROINTESTINAL PANEL BY PCR, STOOL (REPLACES STOOL CULTURE)

## 2020-06-23 MED ORDER — LIDOCAINE HCL 1 % IJ SOLN
INTRAMUSCULAR | Status: AC
  Filled 2020-06-23: qty 20

## 2020-06-23 MED ORDER — OXYCODONE-ACETAMINOPHEN 5-325 MG PO TABS
1.0000 | ORAL_TABLET | Freq: Four times a day (QID) | ORAL | Status: DC | PRN
  Administered 2020-06-23 – 2020-06-29 (×5): 1 via ORAL
  Filled 2020-06-23 (×4): qty 1

## 2020-06-23 MED ORDER — ONDANSETRON 4 MG PO TBDP
4.0000 mg | ORAL_TABLET | Freq: Three times a day (TID) | ORAL | Status: DC | PRN
  Administered 2020-06-25: 4 mg via ORAL
  Filled 2020-06-23: qty 1

## 2020-06-23 MED ORDER — MIDAZOLAM HCL 2 MG/2ML IJ SOLN
INTRAMUSCULAR | Status: AC | PRN
  Administered 2020-06-23: 0.5 mg via INTRAVENOUS
  Administered 2020-06-23: 1 mg via INTRAVENOUS
  Administered 2020-06-23: 0.5 mg via INTRAVENOUS

## 2020-06-23 MED ORDER — LIDOCAINE-EPINEPHRINE 1 %-1:100000 IJ SOLN
INTRAMUSCULAR | Status: AC
  Filled 2020-06-23: qty 1

## 2020-06-23 MED ORDER — ONDANSETRON HCL 4 MG/2ML IJ SOLN: 4.0000 mg | Freq: Once | INTRAMUSCULAR | Status: AC

## 2020-06-23 MED ORDER — CEFAZOLIN SODIUM-DEXTROSE 2-4 GM/100ML-% IV SOLN
INTRAVENOUS | Status: AC
  Administered 2020-06-23: 2 g via INTRAVENOUS
  Filled 2020-06-23: qty 100

## 2020-06-23 MED ORDER — FENTANYL CITRATE (PF) 100 MCG/2ML IJ SOLN
INTRAMUSCULAR | Status: AC | PRN
  Administered 2020-06-23 (×3): 25 ug via INTRAVENOUS

## 2020-06-23 MED ORDER — NEPRO/CARBSTEADY PO LIQD
237.0000 mL | Freq: Three times a day (TID) | ORAL | Status: DC
  Administered 2020-06-24 – 2020-06-29 (×4): 237 mL via ORAL

## 2020-06-23 MED ORDER — MIDAZOLAM HCL 2 MG/2ML IJ SOLN
INTRAMUSCULAR | Status: AC
  Filled 2020-06-23: qty 2

## 2020-06-23 MED ORDER — CHLORHEXIDINE GLUCONATE CLOTH 2 % EX PADS
6.0000 | MEDICATED_PAD | Freq: Every day | CUTANEOUS | Status: DC
  Administered 2020-06-24 – 2020-06-28 (×5): 6 via TOPICAL

## 2020-06-23 MED ORDER — FENTANYL CITRATE (PF) 100 MCG/2ML IJ SOLN
INTRAMUSCULAR | Status: AC
  Filled 2020-06-23: qty 2

## 2020-06-23 MED ORDER — CALCIUM CARBONATE ANTACID 500 MG PO CHEW
400.0000 mg | CHEWABLE_TABLET | Freq: Three times a day (TID) | ORAL | Status: DC
  Administered 2020-06-23 – 2020-06-29 (×10): 400 mg via ORAL
  Filled 2020-06-23 (×13): qty 2

## 2020-06-23 MED ORDER — HEPARIN SODIUM (PORCINE) 1000 UNIT/ML IJ SOLN
INTRAMUSCULAR | Status: AC
  Administered 2020-06-23: 3800 [IU]
  Filled 2020-06-23: qty 1

## 2020-06-23 MED ORDER — CEFAZOLIN SODIUM-DEXTROSE 2-4 GM/100ML-% IV SOLN: 2.0000 g | Freq: Once | INTRAVENOUS | Status: AC

## 2020-06-23 MED ORDER — ONDANSETRON HCL 4 MG/2ML IJ SOLN
4.0000 mg | Freq: Three times a day (TID) | INTRAMUSCULAR | Status: DC | PRN
  Filled 2020-06-23: qty 2

## 2020-06-23 MED ORDER — LIDOCAINE HCL (PF) 1 % IJ SOLN
INTRAMUSCULAR | Status: AC | PRN
  Administered 2020-06-23: 10 mL

## 2020-06-23 MED ORDER — LOPERAMIDE HCL 2 MG PO CAPS
2.0000 mg | ORAL_CAPSULE | Freq: Three times a day (TID) | ORAL | Status: DC
  Administered 2020-06-23 – 2020-06-25 (×3): 2 mg via ORAL
  Filled 2020-06-23 (×9): qty 1

## 2020-06-23 MED ORDER — LIDOCAINE-EPINEPHRINE (PF) 1 %-1:200000 IJ SOLN
INTRAMUSCULAR | Status: AC | PRN
  Administered 2020-06-23: 10 mL

## 2020-06-23 MED ORDER — CALCIUM GLUCONATE-NACL 1-0.675 GM/50ML-% IV SOLN
1.0000 g | Freq: Once | INTRAVENOUS | Status: AC
  Administered 2020-06-23: 1000 mg via INTRAVENOUS
  Filled 2020-06-23 (×2): qty 50

## 2020-06-23 MED ORDER — GELATIN ABSORBABLE 12-7 MM EX MISC
1.0000 | Freq: Once | CUTANEOUS | Status: AC
  Administered 2020-06-23: 1 via TOPICAL
  Filled 2020-06-23: qty 1

## 2020-06-23 NOTE — Care Management (Signed)
1441 06-23-20 Case Manager reached out to [place]- regarding new HD for this patient. Patient is currently without insurance and will need assistance with the financial application. Case Manager will continue to follow for additional transition of care needs. Bethena Roys, RN,BSN Case Manager

## 2020-06-23 NOTE — Progress Notes (Signed)
HOSPITAL MEDICINE OVERNIGHT EVENT NOTE    Notified by nursing the patient is complaining of severe pain of the right chest status post placement of temporary dialysis catheter.  On nursing examination, patient does not appear to be in respiratory distress.  There is no evidence of subcutaneous emphysema or active bleeding at the site of the catheter placement.  On lung exam, equal breath sounds on both sides.  We will order as needed Percocet for moderate to severe pain.  Brian Emerald  MD [facility]

## 2020-06-23 NOTE — Progress Notes (Signed)
PROGRESS NOTE    Brian Duffy.  ZOX:[id number] DOB: [birth year] DOA: 06/20/2020 PCP: Patient, [name]  Brief Narrative:  29 year old male with no past medical history other than severe loss of appetite nausea came to emergency room Found to have severe azotemia/creatinine 130/24 CO2 13 gap 16 Hemoglobin 5.6 WBC 3 platelet 111 Transfused 2 units PRBC  So far  [-] hep C hep B, ANA, GBM membrane  Going for[facility]DC as is now comitting to Dialysis  Assessment & Plan:   Principal Problem:   AKI (acute kidney injury) ([place]) Active Problems:   Uremia   Metabolic acidosis   Normocytic anemia   Thrombocytopenia ([place])   1. Severe azotemia with metabolic acidosis probably from renal failure a. Patient has nausea and vomiting probably from azotemia b. Start zofran 4 mg odt and IV if needed for refractory sym c. He has been counseled quite in detail by Dr. Johnney Ou who recs dialysis d. Bicarb has been changed to p.o. replacement e. Appreciate nephrology input 2. Diarrhea-recurrent a. GI path panel 10/6 neg b. Start imodium and monitor for effect 3. Recurrent hypocalcemia- 4. Probably form 2/2 hypoparthyroid-Renal losses a. Replaced with IV Ca gluconate-added tums b. Get renal panel in am 5. Severe anemia probably secondary to impending ESRD a. Hemoccult never obtained but unlikely to be GI bleed as he is not having any actual diarrhea b. Iron stores are actually paradoxically high 6. Nausea vomiting anorexia a. From renal issues 7. BMI 21  DVT prophylaxis: Lovenox Code Status: Full presumed Family Communication: None at bedside today--met GF on 10/6 Disposition:   Status is: Inpatient  Remains inpatient appropriate because:Hemodynamically unstable, Persistent severe electrolyte disturbances, Unsafe d/c plan and IV treatments appropriate due to intensity of illness or inability to take PO   Dispo: The patient is from: Home              Anticipated d/c is to: Home               Anticipated d/c date is: > 3 days              Patient currently is not medically stable to d/c.       Consultants:   Nephrology  Procedures: None  Antimicrobials: None   Subjective:  Nauseous today but self reolsved Had a sandwich yesterday No cp fever tol some po Passing some urine  Objective: Vitals:   06/22/20 1705 06/22/20 2126 06/23/20 0548 06/23/20 1059  BP: (!) 149/96 (!) 141/91 (!) 144/93 (!) 158/98  Pulse: 81 81 72 71  Resp: _0 Temp: 98.8 F (37.1 C) 98.9 F (37.2 C) 98.9 F (37.2 C) 98 F (36.7 C)  TempSrc: Oral Oral Oral Oral  SpO2: 99% 95% 98% 97%  Weight:  77.8 kg    Height:        Intake/Output Summary (Last 24 hours) at 06/23/2020 1332 Last data filed at 06/23/2020 0600 Gross per 24 hour  Intake 240 ml  Output 600 ml  Net -360 ml   Filed Weights   06/20/20 2302 06/21/20 2114 06/22/20 2126  Weight: 74.8 kg 74.9 kg 77.8 kg    Examination:  General exam: EOMI NCAT looks about stated age no distress Respiratory system: Clear no added sound rales rhonchi Cardiovascular system: S1-S2 no murmur rub or gallop Gastrointestinal system: Soft nontender no rebound no guarding.  Data Reviewed: I have personally reviewed following labs and imaging studies BUNs/creatinine stable 124/22.6 Anion gap 17 Bic  15-->20   Radiology Studies: No results found.   Scheduled Meds:  amLODipine  5 mg Oral Daily   calcium carbonate  400 mg of elemental calcium Oral TID WC   Chlorhexidine Gluconate Cloth  6 each Topical Q0600   heparin  5,000 Units Subcutaneous Q8H   loperamide  2 mg Oral Q8H   ondansetron (ZOFRAN) IV  4 mg Intravenous Once   sodium bicarbonate  650 mg Oral TID   Continuous Infusions:  sodium chloride Stopped (06/21/20 0509)     LOS: 2 days   Time spent: [place], MD [facility] To contact the attending provider between 7A-7P or the covering provider during after hours 7P-7A, please log into  the web site [email or web address] and access using universal [place] password for that web site. If you do not have the password, please call the hospital operator.  06/23/2020, 1:32 PM

## 2020-06-23 NOTE — CV Procedure (Signed)
Interventional Radiology Procedure Note  Procedure: Tunneled dialysis catheter placement  Complications: None immediate  Estimated Blood Loss: <10 cc Recommendations: Catheter is ready for immediate use. Tip is at the cavoatrial junction.   Signed,  Constance Holster, MD

## 2020-06-23 NOTE — Progress Notes (Addendum)
[facility] Progress Note   Subjective:   UOP 1L, net +1.2L for admission.  Continues to feel poorly with dysgeusia and nausea. NPO.  Going for [facility] placement this AM.   Objective Vitals:   06/22/20 0846 06/22/20 1705 06/22/20 2126 06/23/20 0548  BP: (!) 146/79 (!) 149/96 (!) 141/91 (!) 144/93  Pulse: 75 81 81 72  Resp: 14 18 16 14   Temp: 98.8 F (37.1 C) 98.8 F (37.1 C) 98.9 F (37.2 C) 98.9 F (37.2 C)  TempSrc: Oral Oral Oral Oral  SpO2: 99% 99% 95% 98%  Weight:   77.8 kg   Height:       Physical Exam General: comfortable Heart: RRR Lungs:clear Extremities: no edema Neuro: nonfocal Psych:  Normal mood and affect in circumstances  Additional Objective Labs: Basic Metabolic Panel: Recent Labs  Lab 06/21/20 1427 06/22/20 0052 06/23/20 0249  NA 142 141 140  K 4.4 4.1 3.7  CL 110 108 103  CO2 15* 15* 20*  GLUCOSE 80 79 99  BUN 123* 124* 124*  CREATININE 22.42* 22.27* 22.61*  CALCIUM 6.3*   6.2 6.6* 6.4*  PHOS 10.3*  --   --    Liver Function Tests: Recent Labs  Lab 06/21/20 1427 06/22/20 0052 06/23/20 0249  AST  --  11* 13*  ALT  --  15 17  ALKPHOS  --  28* 31*  BILITOT  --  0.9 0.8  PROT  --  5.2* 5.8*  ALBUMIN 3.2* 3.3* 3.5   No results for input(s): LIPASE, AMYLASE in the last 168 hours. CBC: Recent Labs  Lab 06/20/20 2000 06/21/20 1427 06/22/20 0052  WBC 3.3* 2.7* 4.1  NEUTROABS  --  1.4* 2.4  HGB 5.6* 7.0* 7.5*  HCT 17.5* 20.9* 22.0*  MCV 97.2 92.5 92.4  PLT 111* 96* 96*   Blood Culture No results found for: SDES, SPECREQUEST, CULT, REPTSTATUS  Cardiac Enzymes: No results for input(s): CKTOTAL, CKMB, CKMBINDEX, TROPONINI in the last 168 hours. CBG: No results for input(s): GLUCAP in the last 168 hours. Iron Studies:  Recent Labs    06/21/20 1427  IRON 131  TIBC 176*  FERRITIN 222   @lablastinr3 @ Studies/Results: No results found. Medications:  sodium chloride Stopped (06/21/20 0509)   calcium gluconate       amLODipine  5 mg Oral Daily   calcium carbonate  400 mg of elemental calcium Oral TID WC   Chlorhexidine Gluconate Cloth  6 each Topical Q0600   heparin  5,000 Units Subcutaneous Q8H   sodium bicarbonate  650 mg Oral TID    Dialysis Orders:  Assessment/Plan: **ESRD:  I suspect this represents ESRD given labs and appearance of kidneys on [place] with diffuse echogenicity, small, somewhat atrophic appearance.  He has general serologic evaluation in process --> suspect it will be bland but will follow; less important now as this is likely ESRD but will be important for future transplant. We had a very long discussion about the findings in context of his symptoms and I recommend starting dialysis.  He initially was reluctant, but I think mainly because he was processing the info.  He's agreeable and will have TDC placement today with HD #1, HD #2 tomorrow.  Vein map, will consult [facility] for per access.  Receiving education re: ESRD, diet, etc.  We discussed transplant process yesterday and today.   **HTN:  Initiated amlodipine 5mg  daily.   **Pancytopenia:  Anemia expected with CKD but not pancytopenia.  Iron ok, will initiate  ESA if remains admitted. Follow but consider hematology input if not improving.  HIV neg.   **Metabolic acidosis: start po bicarb.  D/c bicarb gtt.  **N/V/diarrhea:  Suspect mostly uremia; Gi pathogen panel neg.Marland Kitchen  Dispo: pending initiation of dialysis and arrangement of outpt HD.    Jannifer Hick MD 06/23/2020, 9:18 AM  [facility] Pager: [phone]

## 2020-06-23 NOTE — Plan of Care (Signed)
  Problem: Education: Goal: Knowledge of General Education information will improve Description: Including pain rating scale, medication(s)/side effects and non-pharmacologic comfort measures Outcome: Progressing   Problem: Clinical Measurements: Goal: Ability to maintain clinical measurements within normal limits will improve Outcome: Progressing   

## 2020-06-23 NOTE — Progress Notes (Signed)
Initial Nutrition Assessment  DOCUMENTATION CODES:   Severe malnutrition in context of acute illness/injury  INTERVENTION:   Provided renal diet education   Once pt returns to renal diet, recommend:  Nepro Shake po TID, each supplement provides 425 kcal and 19 grams protein    NUTRITION DIAGNOSIS:   Severe Malnutrition related to acute illness (AKI) as evidenced by energy intake < or equal to 50% for > or equal to 5 days, moderate fat depletion, moderate muscle depletion, severe muscle depletion.    GOAL:   Patient will meet greater than or equal to 90% of their needs    MONITOR:   Labs, Skin, I & O's, Diet advancement  REASON FOR ASSESSMENT:   Consult Diet education  ASSESSMENT:   Pt with no PMH admitted with AKI, uremia, and metabolic acidosis.  Per Nephrology, pt to have [facility] placement today followed by HD#1 and HD#2 tomorrow.   RD consulted to provide renal diet education. Pt reports having fatigue, loss of appetite, and N/V/D for several months PTA. Pt reports that he does not feel like eating and begins feeling nauseated when he attempts to eat. Pt reports that he eats only 2 meals/day at baseline, but states that he has been having difficulty consuming even that for several months.Discussed renal diet with pt and importance of adequate protein intake.   No wt history available for review.   PO Intake: 0%  UOP: 1053ml x24 hours I/O: +1,214.42ml since admit  Labs: Corrected Ca 6.8 (L) Medications: Tums, imodium, zofran, sodium bicarbonate   NUTRITION - FOCUSED PHYSICAL EXAM:    Most Recent Value  Orbital Region No depletion  Upper Arm Region Moderate depletion  Thoracic and Lumbar Region Moderate depletion  Buccal Region Mild depletion  Temple Region Mild depletion  Clavicle Bone Region Moderate depletion  Clavicle and Acromion Bone Region Moderate depletion  Scapular Bone Region Moderate depletion  Dorsal Hand Mild depletion  Patellar Region  Moderate depletion  Anterior Thigh Region Severe depletion  Posterior Calf Region Moderate depletion  Edema (RD Assessment) Mild  Hair Reviewed  Eyes Reviewed  Mouth Reviewed  Skin Reviewed  Nails Reviewed       Diet Order:   Diet Order            Diet NPO time specified  Diet effective midnight                 EDUCATION NEEDS:   Education needs have been addressed  Skin:  Skin Assessment: Reviewed RN Assessment  Last BM:  10/8  Height:   Ht Readings from Last 1 Encounters:  06/20/20 6\' 2"  (1.88 m)    Weight:   Wt Readings from Last 1 Encounters:  06/22/20 77.8 kg   BMI:  Body mass index is 22.02 kg/m.  Estimated Nutritional Needs:   Kcal:  6010-9323  Protein:  115-130 grams  Fluid:  1L + UOP    Larkin Ina, MS, RD, LDN RD pager number and weekend/on-call pager number located in [place].

## 2020-06-23 NOTE — Progress Notes (Signed)
Renal Navigator met with patient at bedside to discuss outpatient HD referral as requested by Dr. Arlyss Gandy. Patient was sleeping when Navigator arrived, but easily woke up and spoke with Navigator. He reports understanding of need to start HD. He states plan to get catheter placed today, with first treatment after. Navigator asked him if he has been thinking about permanent access and he replied, "I want whatever is the least risk for infection." Navigator confirmed, then, that he definitely wants to get permanent access surgery and that this is what is recommended. Navigator stressed the importance for him to get it during this hospitalization. He stated understanding. Patient states he has his own transportation. He states he has worked for a total of approximately 3 years, at [employer], a [place] and [employer]. Navigator is not sure this will qualify him for Medicare. Navigator called CM to request that a financial be called to ensure that a Medicaid application be started while patient in here and appreciates her assistance. Navigator informed patient that [facility] outpatient HD clinic appears to be closest to his address. Patient states he will be moving to "[place]" in the near future. [place] will still not be far from there, though [place] may be a little closer. This was explained, however, Navigator does not think [facility] has any availability at this time. Patient agrees that the [place] location will be fine. Navigator submitted application to [facility] to request treatment at the [facility] outpatient HD clinic. Navigator will follow closely. His referral will take longer than the typical timeframe since he is uninsured.  Alphonzo Cruise, [place] Renal Navigator [phone]

## 2020-06-23 NOTE — Plan of Care (Signed)
  Problem: Education: Goal: Knowledge of General Education information will improve Description: Including pain rating scale, medication(s)/side effects and non-pharmacologic comfort measures Outcome: Progressing   Problem: Clinical Measurements: Goal: Ability to maintain clinical measurements within normal limits will improve Outcome: Progressing Goal: Will remain free from infection Outcome: Progressing Goal: Diagnostic test results will improve Outcome: Progressing Goal: Respiratory complications will improve Outcome: Progressing Goal: Cardiovascular complication will be avoided Outcome: Progressing   Problem: Coping: Goal: Level of anxiety will decrease Outcome: Progressing   Problem: Elimination: Goal: Will not experience complications related to bowel motility Outcome: Progressing Goal: Will not experience complications related to urinary retention Outcome: Progressing   Problem: Safety: Goal: Ability to remain free from injury will improve Outcome: Progressing   Problem: Skin Integrity: Goal: Risk for impaired skin integrity will decrease Outcome: Progressing   Problem: Education: Goal: Knowledge of disease and its progression will improve Outcome: Progressing   Problem: Health Behavior/Discharge Planning: Goal: Ability to manage health-related needs will improve Outcome: Progressing   Problem: Clinical Measurements: Goal: Complications related to the disease process or treatment will be avoided or minimized Outcome: Progressing Goal: Dialysis access will remain free of complications Outcome: Progressing   Problem: Activity: Goal: Activity intolerance will improve Outcome: Progressing   Problem: Fluid Volume: Goal: Fluid volume balance will be maintained or improved Outcome: Progressing   Problem: Nutritional: Goal: Ability to make appropriate dietary choices will improve Outcome: Progressing   Problem: Respiratory: Goal: Respiratory symptoms  related to disease process will be avoided Outcome: Progressing   Problem: Self-Concept: Goal: Body image disturbance will be avoided or minimized Outcome: Progressing   Problem: Urinary Elimination: Goal: Progression of disease will be identified and treated Outcome: Progressing

## 2020-06-23 NOTE — Consult Note (Signed)
Chief Complaint: Patient was seen in consultation today for renal failure  Referring Physician(s): Dr. Johnney Ou  Supervising Physician: Jacqulynn Cadet  Patient Status: [facility] - In-pt  History of Present Illness: Brian Duffy. is a 29 y.o. male admitted 06/20/20 with progressive weakness, fatigue, and inability to tolerate POs.  Patient found to have creatinine of 24.4, BUN 130.  Admitted for acute renal failure.  After several days of conservative therapy without improvement, now thought to be ESRD and patient will need to initiate dialysis. Request now made for tunneled dialysis catheter placement.   Patient assessed at bedside. Asking if getting dialysis means he can go home soon.  He does complain of nausea. He has otherwise been NPO and heparin injections held. He is agreeable to dialysis catheter placement.     History reviewed. No pertinent past medical history.  History reviewed. No pertinent surgical history.  Allergies: Patient has no known allergies.  Medications: Prior to Admission medications   Not on File     History reviewed. No pertinent family history.  Social History   Socioeconomic History   Marital status: Single    Spouse name: Not on file   Number of children: Not on file   Years of education: Not on file   Highest education level: Not on file  Occupational History   Not on file  Tobacco Use   Smoking status: Current Every Day Smoker    Types: Cigarettes   Smokeless tobacco: Never Used   Tobacco comment: 3 to 4 cigarettes daily  Substance and Sexual Activity   Alcohol use: Not Currently   Drug use: Yes    Types: Marijuana   Sexual activity: Not on file  Other Topics Concern   Not on file  Social History Narrative   Not on file   Social Determinants of Health   Financial Resource Strain:    Difficulty of Paying Living Expenses: Not on file  Food Insecurity:    Worried About [place] in the Last Year: Not  on file   Ran Out of Food in the Last Year: Not on file  Transportation Needs:    Lack of Transportation (Medical): Not on file   Lack of Transportation (Non-Medical): Not on file  Physical Activity:    Days of Exercise per Week: Not on file   Minutes of Exercise per Session: Not on file  Stress:    Feeling of Stress : Not on file  Social Connections:    Frequency of Communication with Friends and Family: Not on file   Frequency of Social Gatherings with Friends and Family: Not on file   Attends Religious Services: Not on file   Active Member of Clubs or Organizations: Not on file   Attends Archivist Meetings: Not on file   Marital Status: Not on file     Review of Systems: A 12 point ROS discussed and pertinent positives are indicated in the HPI above.  All other systems are negative.  Review of Systems  Constitutional: Negative for fatigue and fever.  Respiratory: Negative for cough and shortness of breath.   Cardiovascular: Negative for chest pain.  Gastrointestinal: Negative for abdominal pain, diarrhea, nausea and vomiting.  Musculoskeletal: Negative for back pain.  Psychiatric/Behavioral: Negative for behavioral problems and confusion.    Vital Signs: BP (!) 158/98 (BP Location: Left Arm)    Pulse 71    Temp 98 F (36.7 C) (Oral)    Resp 18  Ht 6\' 2"  (1.88 m)    Wt 171 lb 8.3 oz (77.8 kg)    SpO2 97%    BMI 22.02 kg/m   Physical Exam Vitals and nursing note reviewed.  Constitutional:      General: He is not in acute distress.    Appearance: Normal appearance. He is not ill-appearing.  HENT:     Mouth/Throat:     Mouth: Mucous membranes are moist.     Pharynx: Oropharynx is clear.  Cardiovascular:     Rate and Rhythm: Normal rate and regular rhythm.  Pulmonary:     Effort: Pulmonary effort is normal. No respiratory distress.     Breath sounds: Normal breath sounds.  Abdominal:     General: Abdomen is flat.     Palpations: Abdomen is  soft.  Musculoskeletal:     Cervical back: Normal range of motion and neck supple.  Skin:    General: Skin is warm and dry.  Neurological:     General: No focal deficit present.     Mental Status: He is alert and oriented to person, place, and time. Mental status is at baseline.  Psychiatric:        Mood and Affect: Mood normal.        Behavior: Behavior normal.        Thought Content: Thought content normal.        Judgment: Judgment normal.      MD Evaluation Airway: WNL Heart: WNL Abdomen: WNL Chest/ Lungs: WNL ASA  Classification: 3 Mallampati/Airway Score: Two   Imaging: US Renal  Result Date: 06/21/2020 CLINICAL DATA:  Acute kidney injury EXAM: RENAL / URINARY TRACT ULTRASOUND COMPLETE COMPARISON:  None. FINDINGS: Right Kidney: Renal measurements: 8.7 x 3.6 x 4.7 cm = volume: 77 mL. There is diffusely increased echogenicity with atrophic appearance. Several anechoic cysts are seen within the right kidney the largest measuring 2.8 x 2.1 x 2.6 cm. Left Kidney: Renal measurements: 8.8 x 5.7 x 5.3 cm = volume: 140 mL. Diffusely increased echogenicity with a slightly atrophic appearance. Several anechoic cysts are seen within the left kidney measuring 3.2 x 2.0 x 2.8 cm Bladder: Appears normal for degree of bladder distention. Other: None. IMPRESSION: Diffusely increased parenchymal echogenicity with several anechoic cyst. This could be due to medical renal disease with possible polycystic kidneys. Electronically Signed   By: Prudencio Pair M.D.   On: 06/21/2020 00:45    Labs:  CBC: Recent Labs    06/20/20 2000 06/21/20 1427 06/22/20 0052  WBC 3.3* 2.7* 4.1  HGB 5.6* 7.0* 7.5*  HCT 17.5* 20.9* 22.0*  PLT 111* 96* 96*    COAGS: No results for input(s): INR, APTT in the last 8760 hours.  BMP: Recent Labs    06/20/20 2000 06/21/20 1427 06/22/20 0052 06/23/20 0249  NA 139 142 141 140  K 4.7 4.4 4.1 3.7  CL 110 110 108 103  CO2 13* 15* 15* 20*  GLUCOSE 125* 80 79  99  BUN 130* 123* 124* 124*  CALCIUM 6.6* 6.3*   6.2 6.6* 6.4*  CREATININE 24.48* 22.42* 22.27* 22.61*  GFRNONAA 2* 2* 2* 2*    LIVER FUNCTION TESTS: Recent Labs    06/21/20 1427 06/22/20 0052 06/23/20 0249  BILITOT  --  0.9 0.8  AST  --  11* 13*  ALT  --  15 17  ALKPHOS  --  28* 31*  PROT  --  5.2* 5.8*  ALBUMIN 3.2* 3.3* 3.5  TUMOR MARKERS: No results for input(s): AFPTM, CEA, CA199, CHROMGRNA in the last 8760 hours.  Assessment and Plan: ESRD  Patient presents with renal failure in need of dialysis. IR consulted for placement of tunneled dialysis catheter. Patient assessed at bedside.   He has been NPO. His heparin has been held.  He is agreeable to placement.    Risks and benefits discussed with the patient including, but not limited to bleeding, infection, vascular injury, pneumothorax which may require chest tube placement, air embolism or even death  All of the patient's questions were answered, patient is agreeable to proceed. Consent signed and in chart.   Thank you for this interesting consult.  I greatly enjoyed meeting [employer]. and look forward to participating in their care.  A copy of this report was sent to the requesting provider on this date.  Electronically Signed: Docia Barrier, PA 06/23/2020, 1:07 PM   I spent a total of 40 Minutes    in face to face in clinical consultation, greater than 50% of which was counseling/coordinating care for end stage renal disease.

## 2020-06-23 NOTE — Progress Notes (Signed)
Patient c/o nausea, no PRN meds, Samtani MD notified.  Verbal order for Zofran 4mg , IV, once, orders placed and followed. Will continue to monitor.

## 2020-06-23 NOTE — Consult Note (Addendum)
Hospital Consult    Reason for Consult:  Permanent dialysis access Requesting Physician:  Johnney Ou MRN #:  [id number]  History of Present Illness: This is a 29 y.o. male who presented to the hospital a couple of days ago with nausea, diarrhea and abdominal pain.  He has been tired for a few months.   He was found to have progressive CKD >> renal failure.  He is found to have anemia and was transfused.  He also has thrombocytopenia with platelet count of 96k yesterday.  He states he has had high blood pressure in the past and thinks he was told his pressure was high in high school.    The pt is not on a statin for cholesterol management.  The pt is not on a daily aspirin.   Other AC:  Sq heparin  The pt is on CCB for hypertension.   The pt is not diabetic.   Tobacco hx:  Current (cigarettes/marijuana)  History reviewed. No pertinent past medical history.  History reviewed. No pertinent surgical history.  No Known Allergies  Prior to Admission medications   Not on File    Social History   Socioeconomic History  . Marital status: Single    Spouse name: Not on file  . Number of children: Not on file  . Years of education: Not on file  . Highest education level: Not on file  Occupational History  . Not on file  Tobacco Use  . Smoking status: Current Every Day Smoker    Types: Cigarettes  . Smokeless tobacco: Never Used  . Tobacco comment: 3 to 4 cigarettes daily  Substance and Sexual Activity  . Alcohol use: Not Currently  . Drug use: Yes    Types: Marijuana  . Sexual activity: Not on file  Other Topics Concern  . Not on file  Social History Narrative  . Not on file   Social Determinants of Health   Financial Resource Strain:   . Difficulty of Paying Living Expenses: Not on file  Food Insecurity:   . Worried About Charity fundraiser in the Last Year: Not on file  . Ran Out of Food in the Last Year: Not on file  Transportation Needs:   . Lack of Transportation  (Medical): Not on file  . Lack of Transportation (Non-Medical): Not on file  Physical Activity:   . Days of Exercise per Week: Not on file  . Minutes of Exercise per Session: Not on file  Stress:   . Feeling of Stress : Not on file  Social Connections:   . Frequency of Communication with Friends and Family: Not on file  . Frequency of Social Gatherings with Friends and Family: Not on file  . Attends Religious Services: Not on file  . Active Member of Clubs or Organizations: Not on file  . Attends Archivist Meetings: Not on file  . Marital Status: Not on file  Intimate Partner Violence:   . Fear of Current or Ex-Partner: Not on file  . Emotionally Abused: Not on file  . Physically Abused: Not on file  . Sexually Abused: Not on file    Family hx:  He states his mom told him he has an uncle or great uncle that had renal failure  ROS: [x]  Positive   [ ]  Negative   [ ]  All sytems reviewed and are negative  Cardiac: []  chest pain/pressure  Vascular: []  pain in legs while walking []  pain in legs at rest []   swelling in legs  Pulmonary: []  productive cough []  asthma/wheezing []  home O2  Neurologic: []  hx of CVA []  mini stroke   Hematologic: [x]  anemia   Endocrine:   []  diabetes []  thyroid disease  GI [x]  nausea/diarrhea/abdominal pain/cramping  GU: [x]  CKD/renal failure []  HD--[]  M/W/F or []  T/T/S  Psychiatric: []  anxiety []  depression  Musculoskeletal: []  arthritis []  joint pain  Integumentary: []  rashes []  ulcers  Constitutional: []  fever  []  chills   Physical Examination  Vitals:   06/23/20 0548 06/23/20 1059  BP: (!) 144/93 (!) 158/98  Pulse: 72 71  Resp: 14 18  Temp: 98.9 F (37.2 C) 98 F (36.7 C)  SpO2: 98% 97%   Body mass index is 22.02 kg/m.  General:  WDWN in NAD Gait: Not observed HENT: WNL, normocephalic Pulmonary: normal non-labored breathing, without Rales, rhonchi,  wheezing Cardiac: regular Abdomen:  soft,  NT/ND, no masses Skin: without rashes Vascular Exam/Pulses:  Right Left  Radial 2+ (normal) 2+ (normal)  Ulnar 2+ (normal) 2+ (normal)  DP 2+ (normal) 2+ (normal)  PT Unable to palpate Unable to palpate   Extremities: without ischemic changes, without Gangrene , without cellulitis; without open wounds;  Musculoskeletal: no muscle wasting or atrophy  Neurologic: A&O X 3;  No focal weakness or paresthesias are detected; speech is fluent/normal Psychiatric:  The pt has Normal affect.   CBC    Component Value Date/Time   WBC 4.1 06/22/2020 0052   RBC 2.38 (L) 06/22/2020 0052   HGB 7.5 (L) 06/22/2020 0052   HCT 22.0 (L) 06/22/2020 0052   PLT 96 (L) 06/22/2020 0052   MCV 92.4 06/22/2020 0052   MCH 31.5 06/22/2020 0052   MCHC 34.1 06/22/2020 0052   RDW 14.6 06/22/2020 0052   LYMPHSABS 1.1 06/22/2020 0052   MONOABS 0.2 06/22/2020 0052   EOSABS 0.3 06/22/2020 0052   BASOSABS 0.0 06/22/2020 0052    BMET    Component Value Date/Time   NA 140 06/23/2020 0249   K 3.7 06/23/2020 0249   CL 103 06/23/2020 0249   CO2 20 (L) 06/23/2020 0249   GLUCOSE 99 06/23/2020 0249   BUN 124 (H) 06/23/2020 0249   CREATININE 22.61 (H) 06/23/2020 0249   CALCIUM 6.4 (LL) 06/23/2020 0249   CALCIUM 6.2 06/21/2020 1427   GFRNONAA 2 (L) 06/23/2020 0249    COAGS: No results found for: INR, PROTIME   Non-Invasive Vascular Imaging:   BUE vein mapping is ordered and pending at this time.   ASSESSMENT/PLAN: This is a 29 y.o. male with progressive CKD now in renal failure in need of dialysis access  -pt is getting TDC today in IR -pt is right hand dominant.  He is scheduled for vein mapping.  He appears to have possible vein for [facility] AVF by exam but will await vein mapping.  I discussed fistula vs graft and steal sx.  I discussed with him the possibility of fistula not maturing and may need further intervention and that accesses do not last forever and may need future procedures.   -restrict left arm  and will decide definitely on plan once vein mapping is complete.    Leontine Locket, PA-C [facility] [phone]  I have independently interviewed and examined patient and agree with PA assessment and plan above.  Noted plans for [facility] by interventional radiology.  Will need left arm fistula versus graft and can plan for this early next week, official plan pending vein mapping.  I will touch  base with him this weekend regarding the timing of surgery.  Bijon Mineer C. Donzetta Matters, MD [facility] [place] Office: [phone] Pager: [phone]

## 2020-06-23 NOTE — Progress Notes (Signed)
CRITICAL VALUE ALERT  Critical Value:  6.4  Date & Time Notfiied: 06/23/20 @0455   Provider Notified: MD Shalhoub  Orders Received/Actions taken: awaiting response

## 2020-06-24 ENCOUNTER — Encounter (HOSPITAL_COMMUNITY): Payer: Self-pay

## 2020-06-24 DIAGNOSIS — E43 Unspecified severe protein-calorie malnutrition: Secondary | ICD-10-CM | POA: Insufficient documentation

## 2020-06-24 LAB — COMPREHENSIVE METABOLIC PANEL
ALT: 13 U/L (ref 0–44)
AST: 12 U/L — ABNORMAL LOW (ref 15–41)
Albumin: 3.5 g/dL (ref 3.5–5.0)
Alkaline Phosphatase: 28 U/L — ABNORMAL LOW (ref 38–126)
Anion gap: 14 (ref 5–15)
BUN: 73 mg/dL — ABNORMAL HIGH (ref 6–20)
CO2: 22 mmol/L (ref 22–32)
Calcium: 6.9 mg/dL — ABNORMAL LOW (ref 8.9–10.3)
Chloride: 104 mmol/L (ref 98–111)
Creatinine, Ser: 15.78 mg/dL — ABNORMAL HIGH (ref 0.61–1.24)
GFR, Estimated: 4 mL/min — ABNORMAL LOW (ref >60)
Glucose, Bld: 96 mg/dL (ref 70–99)
Potassium: 3.4 mmol/L — ABNORMAL LOW (ref 3.5–5.1)
Sodium: 140 mmol/L (ref 135–145)
Total Bilirubin: 0.8 mg/dL (ref 0.3–1.2)
Total Protein: 5.7 g/dL — ABNORMAL LOW (ref 6.5–8.1)

## 2020-06-24 LAB — CBC WITH DIFFERENTIAL/PLATELET
Abs Immature Granulocytes: 0.01 10*3/uL (ref 0.00–0.07)
Basophils Absolute: 0 10*3/uL (ref 0.0–0.1)
Basophils Relative: 0 %
Eosinophils Absolute: 0.2 10*3/uL (ref 0.0–0.5)
Eosinophils Relative: 5 %
HCT: 23.9 % — ABNORMAL LOW (ref 39.0–52.0)
Hemoglobin: 7.9 g/dL — ABNORMAL LOW (ref 13.0–17.0)
Immature Granulocytes: 0 %
Lymphocytes Relative: 15 %
Lymphs Abs: 0.6 10*3/uL — ABNORMAL LOW (ref 0.7–4.0)
MCH: 30.2 pg (ref 26.0–34.0)
MCHC: 33.1 g/dL (ref 30.0–36.0)
MCV: 91.2 fL (ref 80.0–100.0)
Monocytes Absolute: 0.3 10*3/uL (ref 0.1–1.0)
Monocytes Relative: 6 %
Neutro Abs: 3.1 10*3/uL (ref 1.7–7.7)
Neutrophils Relative %: 74 %
Platelets: 84 10*3/uL — ABNORMAL LOW (ref 150–400)
RBC: 2.62 MIL/uL — ABNORMAL LOW (ref 4.22–5.81)
RDW: 13.9 % (ref 11.5–15.5)
WBC: 4.2 10*3/uL (ref 4.0–10.5)
nRBC: 0 % (ref 0.0–0.2)

## 2020-06-24 LAB — CBC
HCT: 22.2 % — ABNORMAL LOW (ref 39.0–52.0)
Hemoglobin: 7.3 g/dL — ABNORMAL LOW (ref 13.0–17.0)
MCH: 29.9 pg (ref 26.0–34.0)
MCHC: 32.9 g/dL (ref 30.0–36.0)
MCV: 91 fL (ref 80.0–100.0)
Platelets: 53 10*3/uL — ABNORMAL LOW (ref 150–400)
RBC: 2.44 MIL/uL — ABNORMAL LOW (ref 4.22–5.81)
RDW: 13.8 % (ref 11.5–15.5)
WBC: 3.5 10*3/uL — ABNORMAL LOW (ref 4.0–10.5)
nRBC: 0 % (ref 0.0–0.2)

## 2020-06-24 LAB — HEPATITIS B CORE ANTIBODY, TOTAL: Hep B Core Total Ab: NONREACTIVE

## 2020-06-24 LAB — HEPATITIS B SURFACE ANTIBODY,QUALITATIVE: Hep B S Ab: REACTIVE — AB

## 2020-06-24 LAB — HEPATITIS B SURFACE ANTIGEN: Hepatitis B Surface Ag: NONREACTIVE

## 2020-06-24 MED ORDER — DARBEPOETIN ALFA 60 MCG/0.3ML IJ SOSY
PREFILLED_SYRINGE | INTRAMUSCULAR | Status: AC
  Administered 2020-06-24: 60 ug
  Filled 2020-06-24: qty 0.3

## 2020-06-24 MED ORDER — LIDOCAINE-PRILOCAINE 2.5-2.5 % EX CREA: 1.0000 "application " | TOPICAL_CREAM | CUTANEOUS | Status: DC | PRN

## 2020-06-24 MED ORDER — ALTEPLASE 2 MG IJ SOLR: 2.0000 mg | Freq: Once | INTRAMUSCULAR | Status: DC | PRN

## 2020-06-24 MED ORDER — DARBEPOETIN ALFA 60 MCG/0.3ML IJ SOSY
60.0000 ug | PREFILLED_SYRINGE | INTRAMUSCULAR | Status: DC
  Filled 2020-06-24: qty 0.3

## 2020-06-24 MED ORDER — LIDOCAINE HCL (PF) 1 % IJ SOLN: 5.0000 mL | INTRAMUSCULAR | Status: DC | PRN

## 2020-06-24 MED ORDER — PENTAFLUOROPROP-TETRAFLUOROETH EX AERO: 1.0000 "application " | INHALATION_SPRAY | CUTANEOUS | Status: DC | PRN

## 2020-06-24 MED ORDER — SODIUM CHLORIDE 0.9 % IV SOLN: 100.0000 mL | INTRAVENOUS | Status: DC | PRN

## 2020-06-24 MED ORDER — DARBEPOETIN ALFA 60 MCG/0.3ML IJ SOSY: 60.0000 ug | PREFILLED_SYRINGE | INTRAMUSCULAR | Status: DC

## 2020-06-24 MED ORDER — LIDOCAINE HCL (PF) 1 % IJ SOLN
INTRAMUSCULAR | Status: AC
  Filled 2020-06-24: qty 30

## 2020-06-24 MED ORDER — HEPARIN SODIUM (PORCINE) 1000 UNIT/ML DIALYSIS: 1000.0000 [IU] | INTRAMUSCULAR | Status: DC | PRN

## 2020-06-24 MED ORDER — HEPARIN SODIUM (PORCINE) 1000 UNIT/ML IJ SOLN
INTRAMUSCULAR | Status: AC
  Filled 2020-06-24: qty 4

## 2020-06-24 NOTE — Progress Notes (Signed)
Patient back from hemodialysis

## 2020-06-24 NOTE — Progress Notes (Signed)
°   06/24/20 0415  Hand-Off documentation  Handoff Given Given to shift RN/LPN  Report given to (Full Name) Britta Mccreedy, RN  Handoff Received Received from shift RN/LPN  Report received from (Full Name) Tru Rana, RN  Vitals  Temp 98.9 F (37.2 C)  Temp Source Oral  BP (!) 167/104  BP Location Left Arm  BP Method Automatic  Patient Position (if appropriate) Lying  Pulse Rate 89  Pulse Rate Source Monitor  Resp 14  Oxygen Therapy  SpO2 100 %  O2 Device Room Air  Pain Assessment  Pain Scale 0-10  Pain Score 6  Faces Pain Scale 4  Pain Type Acute pain  Post-Hemodialysis Assessment  Rinseback Volume (mL) 250 mL  KECN 201 V  Dialyzer Clearance Lightly streaked  Duration of HD Treatment -hour(s) 2 hour(s)  Hemodialysis Intake (mL) 500 mL  UF Total -Machine (mL) 500 mL  Net UF (mL) 0 mL  Tolerated HD Treatment Yes  Post-Hemodialysis Comments tx achieved as expected, tolerated very well,  PP  remaianed elevated.  AVG/AVF Arterial Site Held (minutes) 0 minutes  AVG/AVF Venous Site Held (minutes) 0 minutes  Hemodialysis Catheter Right Internal jugular Double lumen Permanent (Tunneled)  Placement Date/Time: 06/23/20 1604   Placed prior to admission: No  Time Out: Correct patient;Correct site;Correct procedure  Maximum sterile barrier precautions: Hand hygiene;Cap  Site Prep: Chlorhexidine (preferred);Skin Prep Completely Dry at the T...  Site Condition Drainage  Blue Lumen Status Saline locked;Capped (Central line)  Red Lumen Status Heparin locked;Capped (Central line)  Catheter fill solution Heparin 1000 units/ml  Catheter fill volume (Arterial) 1.9 cc  Catheter fill volume (Venous) 1.9  Dressing Type Gauze/Drain sponge  Dressing Status Old drainage  Drainage Description Sanguineous

## 2020-06-24 NOTE — Progress Notes (Signed)
Paged IR Dr Vernard Gambles regarding HD cath site still bleeding.  HD nurse removed dressing and said still bleeding.  Return call from Dr Vernard Gambles said would come check patient.

## 2020-06-24 NOTE — Progress Notes (Signed)
PROGRESS NOTE    Brian Duffy.  DDU:[id number] DOB: 1991/03/09 DOA: 06/20/2020 PCP: Patient, [name]  Brief Narrative:  29 year old male with no past medical history other than severe loss of appetite nausea came to emergency room Found to have severe azotemia/creatinine 130/24 CO2 13 gap 16 Hemoglobin 5.6 WBC 3 platelet 111 Transfused 2 units PRBC  So far  [-] hep C hep B, ANA, GBM membrane  Going forTDC as is now comitting to Dialysis  Assessment & Plan:   Principal Problem:   AKI (acute kidney injury) ([place]) Active Problems:   Uremia   Metabolic acidosis   Normocytic anemia   Thrombocytopenia (HCC)   Protein-calorie malnutrition, severe   1. Bleeding from right chest dialysis access port a. Area was reinforced and seems to have stopped bleeding although some loose with bandages still showing some blood b. Labs from this morning are still pending 2. Severe azotemia with metabolic acidosis probably from renal failure a. Patient has nausea and vomiting probably from azotemia b. Nausea symptoms better after dialysis x1 and Zofran ODT and IV on board c. Rest as per nephrology 3. Diarrhea-recurrent a. GI path panel 10/6 neg b. Continue Imodium 4. Recurrent hypocalcemia- 5. Probably form 2/2 hypoparthyroid-Renal losses a. Replaced with IV Ca gluconate-added tums b. Get renal panel in am 6. Severe anemia probably secondary to impending ESRD a. Hemoccult never obtained but unlikely to be GI bleed as he is not having any actual diarrhea b. Iron stores are actually paradoxically high 7. Nausea vomiting anorexia a. From renal issues 8. BMI 21  DVT prophylaxis: Lovenox Code Status: Full presumed Family Communication: None at bedside--met GF on 10/6 Disposition:   Status is: Inpatient  Remains inpatient appropriate because:Hemodynamically unstable, Persistent severe electrolyte disturbances, Unsafe d/c plan and IV treatments appropriate due to intensity of illness or  inability to take PO   Dispo: The patient is from: Home              Anticipated d/c is to: Home              Anticipated d/c date is: > 3 days              Patient currently is not medically stable to d/c.       Consultants:   Nephrology  Procedures: None  Antimicrobials: None   Subjective:  Events overnight with bleeding noted He does not feel lightheaded or weak right now and is able to move around He is still quite sleepy He has no chest pain currently no fever no chills  Objective: Vitals:   06/24/20 0330 06/24/20 0400 06/24/20 0415 06/24/20 0436  BP: (!) 161/109 (!) 157/99 (!) 167/104 (!) 173/103  Pulse: 82 85 89 85  Resp: $Remo'13 16 14 18  'RANtO$ Temp:   98.9 F (37.2 C) 98.6 F (37 C)  TempSrc:   Oral Oral  SpO2:   100% 99%  Weight:      Height:        Intake/Output Summary (Last 24 hours) at 06/24/2020 0904 Last data filed at 06/24/2020 0600 Gross per 24 hour  Intake 390 ml  Output 811 ml  Net -421 ml   Filed Weights   06/21/20 2114 06/22/20 2126 06/23/20 2131  Weight: 74.9 kg 77.8 kg 77.8 kg    Examination:  General exam: Awake alert no distress Respiratory system: Clear no added sound rales rhonchi On the front of the chest wall he has/reinforce bandage edges of which close  to the middle of the chest show a little bit of red staining Cardiovascular system: S1-S2 no murmur rub or gallop Gastrointestinal system: Soft nontender no rebound no guarding.  Data Reviewed: I have personally reviewed following labs and imaging studies Labs are pending from this morning   Radiology Studies: IR Fluoro Guide CV Line Right  Result Date: 06/23/2020 INDICATION: 29 year old male with end-stage renal disease requiring dialysis. EXAM: TUNNELED PICC LINE WITH ULTRASOUND AND FLUOROSCOPIC GUIDANCE MEDICATIONS: Ancef IV. The antibiotic was given in an appropriate time interval prior to skin puncture. ANESTHESIA/SEDATION: Versed 2 mg IV; Fentanyl 75 mcg IV; Moderate  Sedation Time:  23 minutes The patient was continuously monitored during the procedure by the interventional radiology nurse under my direct supervision. FLUOROSCOPY TIME:  Fluoroscopy Time: 0 minutes 42 seconds ( mGy). COMPLICATIONS: None immediate. PROCEDURE: Informed written consent was obtained from the patient after a discussion of the risks, benefits, and alternatives to treatment. Questions regarding the procedure were encouraged and answered. The right neck and chest were prepped with chlorhexidine in a sterile fashion, and a sterile drape was applied covering the operative field. Maximum barrier sterile technique with sterile gowns and gloves were used for the procedure. A timeout was performed prior to the initiation of the procedure. After creating a small venotomy incision, a micropuncture kit was utilized to access the right internal jugular vein under direct, real-time ultrasound guidance after the overlying soft tissues were anesthetized with 1% lidocaine with epinephrine. Ultrasound image documentation was performed. The microwire was kinked to measure appropriate catheter length. The micropuncture sheath was exchanged for a peel-away sheath over a guidewire. A 14 French dual lumen tunneled dialysis catheter measuring 23 cm tip to cuff cm was tunneled in a retrograde fashion from the anterior chest wall to the venotomy incision. The catheter was then placed through the peel-away sheath with tip ultimately positioned at the superior caval-atrial junction. Final catheter positioning was confirmed and documented with a spot radiographic image. The catheter aspirates and flushes normally. The catheter was flushed with appropriate volume heparin dwells. The catheter exit site was secured with a 0-Prolene retention suture. The venotomy incision was closed with Dermabond. Dressings were applied. The patient tolerated the procedure well without immediate post procedural complication. FINDINGS: After catheter  placement, the tip lies within the superior cavoatrial junction. The catheter aspirates and flushes normally and is ready for immediate use. IMPRESSION: Successful placement of 23 cm tip to cuffcm dual lumen tunneled dialysis catheter catheter via the right internal jugular vein with tip terminating at the superior caval atrial junction. The catheter is ready for immediate use. Electronically Signed   By: Constance Holster M.D.   On: 06/23/2020 18:48   IR US Guide Vasc Access Right  Result Date: 06/23/2020 INDICATION: 29 year old male with end-stage renal disease requiring dialysis. EXAM: TUNNELED PICC LINE WITH ULTRASOUND AND FLUOROSCOPIC GUIDANCE MEDICATIONS: Ancef IV. The antibiotic was given in an appropriate time interval prior to skin puncture. ANESTHESIA/SEDATION: Versed 2 mg IV; Fentanyl 75 mcg IV; Moderate Sedation Time:  23 minutes The patient was continuously monitored during the procedure by the interventional radiology nurse under my direct supervision. FLUOROSCOPY TIME:  Fluoroscopy Time: 0 minutes 42 seconds ( mGy). COMPLICATIONS: None immediate. PROCEDURE: Informed written consent was obtained from the patient after a discussion of the risks, benefits, and alternatives to treatment. Questions regarding the procedure were encouraged and answered. The right neck and chest were prepped with chlorhexidine in a sterile fashion, and a sterile drape was  applied covering the operative field. Maximum barrier sterile technique with sterile gowns and gloves were used for the procedure. A timeout was performed prior to the initiation of the procedure. After creating a small venotomy incision, a micropuncture kit was utilized to access the right internal jugular vein under direct, real-time ultrasound guidance after the overlying soft tissues were anesthetized with 1% lidocaine with epinephrine. Ultrasound image documentation was performed. The microwire was kinked to measure appropriate catheter length. The  micropuncture sheath was exchanged for a peel-away sheath over a guidewire. A 14 French dual lumen tunneled dialysis catheter measuring 23 cm tip to cuff cm was tunneled in a retrograde fashion from the anterior chest wall to the venotomy incision. The catheter was then placed through the peel-away sheath with tip ultimately positioned at the superior caval-atrial junction. Final catheter positioning was confirmed and documented with a spot radiographic image. The catheter aspirates and flushes normally. The catheter was flushed with appropriate volume heparin dwells. The catheter exit site was secured with a 0-Prolene retention suture. The venotomy incision was closed with Dermabond. Dressings were applied. The patient tolerated the procedure well without immediate post procedural complication. FINDINGS: After catheter placement, the tip lies within the superior cavoatrial junction. The catheter aspirates and flushes normally and is ready for immediate use. IMPRESSION: Successful placement of 23 cm tip to cuffcm dual lumen tunneled dialysis catheter catheter via the right internal jugular vein with tip terminating at the superior caval atrial junction. The catheter is ready for immediate use. Electronically Signed   By: Constance Holster M.D.   On: 06/23/2020 18:48     Scheduled Meds: . heparin sodium (porcine)      . amLODipine  5 mg Oral Daily  . calcium carbonate  400 mg of elemental calcium Oral TID WC  . Chlorhexidine Gluconate Cloth  6 each Topical Q0600  . darbepoetin (ARANESP) injection - DIALYSIS  60 mcg Intravenous Q Sat-HD  . feeding supplement (NEPRO CARB STEADY)  237 mL Oral TID BM  . heparin  5,000 Units Subcutaneous Q8H  . loperamide  2 mg Oral Q8H  . sodium bicarbonate  650 mg Oral TID   Continuous Infusions: . sodium chloride Stopped (06/21/20 0509)  . sodium chloride    . sodium chloride       LOS: 3 days   Time spent: Index, MD [facility] To  contact the attending provider between 7A-7P or the covering provider during after hours 7P-7A, please log into the web site [email or web address] and access using universal [place] password for that web site. If you do not have the password, please call the hospital operator.  06/24/2020, 9:04 AM

## 2020-06-24 NOTE — Progress Notes (Addendum)
Follow-up round on patient secondary to tunneled HD placement and initial HD treatment. Patient reports that he felt "fine" after treatment. He notes mild discomfort to catheter insertion site with mild bleeding noted. Pressure dressing in place with small amount of notable fresh blood to dressing. Drew line to border to monitor for further spread. Floor RN Mitchell Heir and [place] aware. Awaiting assessment for IR. Made patient aware of current plan. Encouraged patient to get OOB and open the blinds, but reports that he is tired because he was unable to sleep last night due to concern for bleeding and inability to sleep on his back. Educated patient on the need to get OOB and ambulate once concern for bleeding is addressed. Also plan for AVG/AVF placement likely on Tuesday to L arm. Pink armband in place. No further questions at this time. Plan to follow-up post fistula placement.   Dorthey Sawyer, RN  Dialysis Nurse Coordinator

## 2020-06-24 NOTE — Plan of Care (Signed)
?  Problem: Clinical Measurements: ?Goal: Respiratory complications will improve ?Outcome: Progressing ?  ?Problem: Elimination: ?Goal: Will not experience complications related to urinary retention ?Outcome: Progressing ?  ?

## 2020-06-24 NOTE — Progress Notes (Signed)
Patient Brian Duffy site on right chest is bleeding  minimally  earlier was reinforced with gauze and stabilized. now pt. back from hemodialysis, reinforced gauze is fully soaked and leaking when patient is standing up moving around. textpaged oncall IR MD Vernard Gambles, received a callback, will see pt. in the morning, to reinforce more gauze and place sandbag for pressure. This RN  encourage patient to lay in bed for now. Will continue to monitor patient.

## 2020-06-24 NOTE — Progress Notes (Signed)
Patient went to hemodialysis

## 2020-06-24 NOTE — Progress Notes (Signed)
[facility] Progress Note   Subjective:   S/p TDC yesterday and completed 1st HD early this AM.  Bleeding from catheter site - IR to eval.  Not sure how he feels currently with everything going on.  Objective Vitals:   06/24/20 0330 06/24/20 0400 06/24/20 0415 06/24/20 0436  BP: (!) 161/109 (!) 157/99 (!) 167/104 (!) 173/103  Pulse: 82 85 89 85  Resp: $Remo'13 16 14 18  'jMAgA$ Temp:   98.9 F (37.2 C) 98.6 F (37 C)  TempSrc:   Oral Oral  SpO2:   100% 99%  Weight:      Height:       Physical Exam General: comfortable Heart: RRR Lungs:clear Extremities: no edema Neuro: nonfocal Psych:  Normal mood and affect in circumstances Dialysis access:  RIJ TDC with large pressure dressing without e/o ongoing bleeding.  Additional Objective Labs: Basic Metabolic Panel: Recent Labs  Lab 06/21/20 1427 06/22/20 0052 06/23/20 0249  NA 142 141 140  K 4.4 4.1 3.7  CL 110 108 103  CO2 15* 15* 20*  GLUCOSE 80 79 99  BUN 123* 124* 124*  CREATININE 22.42* 22.27* 22.61*  CALCIUM 6.3*  6.2 6.6* 6.4*  PHOS 10.3*  --   --    Liver Function Tests: Recent Labs  Lab 06/21/20 1427 06/22/20 0052 06/23/20 0249  AST  --  11* 13*  ALT  --  15 17  ALKPHOS  --  28* 31*  BILITOT  --  0.9 0.8  PROT  --  5.2* 5.8*  ALBUMIN 3.2* 3.3* 3.5   No results for input(s): LIPASE, AMYLASE in the last 168 hours. CBC: Recent Labs  Lab 06/20/20 2000 06/21/20 1427 06/22/20 0052  WBC 3.3* 2.7* 4.1  NEUTROABS  --  1.4* 2.4  HGB 5.6* 7.0* 7.5*  HCT 17.5* 20.9* 22.0*  MCV 97.2 92.5 92.4  PLT 111* 96* 96*   Blood Culture No results found for: SDES, SPECREQUEST, CULT, REPTSTATUS  Cardiac Enzymes: No results for input(s): CKTOTAL, CKMB, CKMBINDEX, TROPONINI in the last 168 hours. CBG: No results for input(s): GLUCAP in the last 168 hours. Iron Studies:  Recent Labs    06/21/20 1427  IRON 131  TIBC 176*  FERRITIN 222   '@lablastinr3'$ @ Studies/Results: IR Fluoro Guide CV Line  Right  Result Date: 06/23/2020 INDICATION: 29 year old male with end-stage renal disease requiring dialysis. EXAM: TUNNELED PICC LINE WITH ULTRASOUND AND FLUOROSCOPIC GUIDANCE MEDICATIONS: Ancef IV. The antibiotic was given in an appropriate time interval prior to skin puncture. ANESTHESIA/SEDATION: Versed 2 mg IV; Fentanyl 75 mcg IV; Moderate Sedation Time:  23 minutes The patient was continuously monitored during the procedure by the interventional radiology nurse under my direct supervision. FLUOROSCOPY TIME:  Fluoroscopy Time: 0 minutes 42 seconds ( mGy). COMPLICATIONS: None immediate. PROCEDURE: Informed written consent was obtained from the patient after a discussion of the risks, benefits, and alternatives to treatment. Questions regarding the procedure were encouraged and answered. The right neck and chest were prepped with chlorhexidine in a sterile fashion, and a sterile drape was applied covering the operative field. Maximum barrier sterile technique with sterile gowns and gloves were used for the procedure. A timeout was performed prior to the initiation of the procedure. After creating a small venotomy incision, a micropuncture kit was utilized to access the right internal jugular vein under direct, real-time ultrasound guidance after the overlying soft tissues were anesthetized with 1% lidocaine with epinephrine. Ultrasound image documentation was performed. The microwire was kinked to measure  appropriate catheter length. The micropuncture sheath was exchanged for a peel-away sheath over a guidewire. A 14 French dual lumen tunneled dialysis catheter measuring 23 cm tip to cuff cm was tunneled in a retrograde fashion from the anterior chest wall to the venotomy incision. The catheter was then placed through the peel-away sheath with tip ultimately positioned at the superior caval-atrial junction. Final catheter positioning was confirmed and documented with a spot radiographic image. The catheter  aspirates and flushes normally. The catheter was flushed with appropriate volume heparin dwells. The catheter exit site was secured with a 0-Prolene retention suture. The venotomy incision was closed with Dermabond. Dressings were applied. The patient tolerated the procedure well without immediate post procedural complication. FINDINGS: After catheter placement, the tip lies within the superior cavoatrial junction. The catheter aspirates and flushes normally and is ready for immediate use. IMPRESSION: Successful placement of 23 cm tip to cuffcm dual lumen tunneled dialysis catheter catheter via the right internal jugular vein with tip terminating at the superior caval atrial junction. The catheter is ready for immediate use. Electronically Signed   By: Constance Holster M.D.   On: 06/23/2020 18:48   IR US Guide Vasc Access Right  Result Date: 06/23/2020 INDICATION: 29 year old male with end-stage renal disease requiring dialysis. EXAM: TUNNELED PICC LINE WITH ULTRASOUND AND FLUOROSCOPIC GUIDANCE MEDICATIONS: Ancef IV. The antibiotic was given in an appropriate time interval prior to skin puncture. ANESTHESIA/SEDATION: Versed 2 mg IV; Fentanyl 75 mcg IV; Moderate Sedation Time:  23 minutes The patient was continuously monitored during the procedure by the interventional radiology nurse under my direct supervision. FLUOROSCOPY TIME:  Fluoroscopy Time: 0 minutes 42 seconds ( mGy). COMPLICATIONS: None immediate. PROCEDURE: Informed written consent was obtained from the patient after a discussion of the risks, benefits, and alternatives to treatment. Questions regarding the procedure were encouraged and answered. The right neck and chest were prepped with chlorhexidine in a sterile fashion, and a sterile drape was applied covering the operative field. Maximum barrier sterile technique with sterile gowns and gloves were used for the procedure. A timeout was performed prior to the initiation of the procedure. After  creating a small venotomy incision, a micropuncture kit was utilized to access the right internal jugular vein under direct, real-time ultrasound guidance after the overlying soft tissues were anesthetized with 1% lidocaine with epinephrine. Ultrasound image documentation was performed. The microwire was kinked to measure appropriate catheter length. The micropuncture sheath was exchanged for a peel-away sheath over a guidewire. A 14 French dual lumen tunneled dialysis catheter measuring 23 cm tip to cuff cm was tunneled in a retrograde fashion from the anterior chest wall to the venotomy incision. The catheter was then placed through the peel-away sheath with tip ultimately positioned at the superior caval-atrial junction. Final catheter positioning was confirmed and documented with a spot radiographic image. The catheter aspirates and flushes normally. The catheter was flushed with appropriate volume heparin dwells. The catheter exit site was secured with a 0-Prolene retention suture. The venotomy incision was closed with Dermabond. Dressings were applied. The patient tolerated the procedure well without immediate post procedural complication. FINDINGS: After catheter placement, the tip lies within the superior cavoatrial junction. The catheter aspirates and flushes normally and is ready for immediate use. IMPRESSION: Successful placement of 23 cm tip to cuffcm dual lumen tunneled dialysis catheter catheter via the right internal jugular vein with tip terminating at the superior caval atrial junction. The catheter is ready for immediate use. Electronically Signed  By: Constance Holster M.D.   On: 06/23/2020 18:48   Medications: . sodium chloride Stopped (06/21/20 0509)  . sodium chloride    . sodium chloride     . heparin sodium (porcine)      . amLODipine  5 mg Oral Daily  . calcium carbonate  400 mg of elemental calcium Oral TID WC  . Chlorhexidine Gluconate Cloth  6 each Topical Q0600  . feeding  supplement (NEPRO CARB STEADY)  237 mL Oral TID BM  . heparin  5,000 Units Subcutaneous Q8H  . loperamide  2 mg Oral Q8H  . sodium bicarbonate  650 mg Oral TID    Dialysis Orders:  Assessment/Plan: **ESRD:  I suspect this represents ESRD given labs and appearance of kidneys on [place] with diffuse echogenicity, small, somewhat atrophic appearance.  He has general serologic evaluation in process --> suspect it will be bland but will follow; less important now as this is likely ESRD but will be important for future transplant.  [place] placement 10/8 with HD #1 overnight, HD #2 today.  VVS appreciated - will place perm access, plan pending vein mapping. Receiving education re: ESRD, diet, etc.  We discussed transplant process this week.   CLIP underway.  **HTN:  Initiated amlodipine 5mg  daily.  Having some pain overnight so maintain current dose as I suspect HTN is pain related.  **Pancytopenia:  Anemia expected with CKD but not pancytopenia.  Iron ok, will initiate ESA. Follow but consider hematology input if not improving.  HIV neg.   **Metabolic acidosis: d/c po bicarb now that dialyzing.  **N/V/diarrhea:  Suspect mostly uremia; Gi pathogen panel neg.Marland Kitchen  Dispo: pending initiation of dialysis and arrangement of outpt HD.    Jannifer Hick MD 06/24/2020, 8:33 AM  [facility] Pager: [phone]

## 2020-06-24 NOTE — Progress Notes (Signed)
Patient ID: Brian Duffy., male   DOB: 29-Mar-1991, 29 y.o.   MRN: [id number] CTSP re oozing at HD catheter skin entry site x all day, soaking multiple dressings, persists post HD.  On exam, venous ooze at skin entry site. No bleeding at retention sutures x2.  Site infiltrated locally with 46ml lidocaine 1%.  Procedure: Purse-string suture placement around skin entry site of R chest tunneled HD CV catheter with 2-0 vicryl (temporary), with complete cessation of oozing. New biopatch and occlusive dressing applied. EBL:   minimal Complications:  none immediate  Ok to use HD catheter. Call if oozing persists. Remove suture in 48hrs if desired.   Dillard Cannon MD Main # [phone] Pager  [phone] Mobile [phone]

## 2020-06-25 ENCOUNTER — Inpatient Hospital Stay (HOSPITAL_COMMUNITY): Payer: Medicaid Other

## 2020-06-25 DIAGNOSIS — N186 End stage renal disease: Secondary | ICD-10-CM

## 2020-06-25 LAB — RENAL FUNCTION PANEL
Albumin: 3.1 g/dL — ABNORMAL LOW (ref 3.5–5.0)
Anion gap: 9 (ref 5–15)
BUN: 40 mg/dL — ABNORMAL HIGH (ref 6–20)
CO2: 28 mmol/L (ref 22–32)
Calcium: 7.1 mg/dL — ABNORMAL LOW (ref 8.9–10.3)
Chloride: 103 mmol/L (ref 98–111)
Creatinine, Ser: 11.35 mg/dL — ABNORMAL HIGH (ref 0.61–1.24)
GFR, Estimated: 5 mL/min — ABNORMAL LOW (ref >60)
Glucose, Bld: 94 mg/dL (ref 70–99)
Phosphorus: 4.4 mg/dL (ref 2.5–4.6)
Potassium: 3.8 mmol/L (ref 3.5–5.1)
Sodium: 140 mmol/L (ref 135–145)

## 2020-06-25 LAB — CBC
HCT: 21.5 % — ABNORMAL LOW (ref 39.0–52.0)
Hemoglobin: 7.1 g/dL — ABNORMAL LOW (ref 13.0–17.0)
MCH: 30.2 pg (ref 26.0–34.0)
MCHC: 33 g/dL (ref 30.0–36.0)
MCV: 91.5 fL (ref 80.0–100.0)
Platelets: 54 10*3/uL — ABNORMAL LOW (ref 150–400)
RBC: 2.35 MIL/uL — ABNORMAL LOW (ref 4.22–5.81)
RDW: 14 % (ref 11.5–15.5)
WBC: 3.7 10*3/uL — ABNORMAL LOW (ref 4.0–10.5)
nRBC: 0 % (ref 0.0–0.2)

## 2020-06-25 MED ORDER — CALCITRIOL 0.25 MCG PO CAPS
0.2500 ug | ORAL_CAPSULE | Freq: Every day | ORAL | Status: DC
  Administered 2020-06-25 – 2020-06-29 (×4): 0.25 ug via ORAL
  Filled 2020-06-25 (×4): qty 1

## 2020-06-25 NOTE — Plan of Care (Signed)
  Problem: Education: Goal: Knowledge of General Education information will improve Description: Including pain rating scale, medication(s)/side effects and non-pharmacologic comfort measures Outcome: Progressing   Problem: Clinical Measurements: Goal: Ability to maintain clinical measurements within normal limits will improve Outcome: Progressing   

## 2020-06-25 NOTE — Progress Notes (Signed)
  Progress Note    06/25/2020 11:14 AM * No surgery date entered *  Subjective: No overnight issues, dialysis catheter in place  Vitals:   06/25/20 0502 06/25/20 0857  BP: (!) 173/98 (!) 140/94  Pulse: 83 78  Resp: 18 18  Temp: 99.3 F (37.4 C) 98.3 F (36.8 C)  SpO2: 100% 98%    Physical Exam: Awake alert oriented Palpable bilateral radial pulses  CBC    Component Value Date/Time   WBC 3.7 (L) 06/25/2020 0105   RBC 2.35 (L) 06/25/2020 0105   HGB 7.1 (L) 06/25/2020 0105   HCT 21.5 (L) 06/25/2020 0105   PLT 54 (L) 06/25/2020 0105   MCV 91.5 06/25/2020 0105   MCH 30.2 06/25/2020 0105   MCHC 33.0 06/25/2020 0105   RDW 14.0 06/25/2020 0105   LYMPHSABS 0.6 (L) 06/24/2020 0603   MONOABS 0.3 06/24/2020 0603   EOSABS 0.2 06/24/2020 0603   BASOSABS 0.0 06/24/2020 0603    BMET    Component Value Date/Time   NA 140 06/25/2020 0105   K 3.8 06/25/2020 0105   CL 103 06/25/2020 0105   CO2 28 06/25/2020 0105   GLUCOSE 94 06/25/2020 0105   BUN 40 (H) 06/25/2020 0105   CREATININE 11.35 (H) 06/25/2020 0105   CALCIUM 7.1 (L) 06/25/2020 0105   CALCIUM 6.2 06/21/2020 1427   GFRNONAA 5 (L) 06/25/2020 0105    INR No results found for: INR   Intake/Output Summary (Last 24 hours) at 06/25/2020 1114 Last data filed at 06/25/2020 0502 Gross per 24 hour  Intake 0 ml  Output 536 ml  Net -536 ml     Assessment/plan:  29 y.o. male is now end-stage renal disease.  Tunneled dialysis catheter has been placed.  We will plan for left arm fistula versus graft in the OR on Tuesday.  Preop tomorrow.    Tyechia Allmendinger C. Donzetta Matters, MD [facility] Office: [phone] Pager: [phone]  06/25/2020 11:14 AM

## 2020-06-25 NOTE — Progress Notes (Signed)
PROGRESS NOTE    [name].  FXJ:[id number] DOB: [birth year] DOA: 06/20/2020 PCP: Patient, [name]  Brief Narrative:  29 year old male with no past medical history other than severe loss of appetite nausea came to emergency room Found to have severe azotemia/creatinine 130/24 CO2 13 gap 16 Hemoglobin 5.6 WBC 3 platelet 111 Transfused 2 units PRBC  So far  [-] hep C hep B, ANA, GBM membrane  Going for[facility] as is now comitting to Dialysis  Assessment & Plan:   Principal Problem:   AKI (acute kidney injury) ([place]) Active Problems:   Uremia   Metabolic acidosis   Normocytic anemia   Thrombocytopenia (HCC)   Protein-calorie malnutrition, severe   1. Bleeding from right chest dialysis access port a. R chest catheter re-sutured 10/10 by IR and stopped bleeding b. Hemoglobin in 7 range and stable 2. Severe azotemia with metabolic acidosis probably from renal failure a. Patient has nausea and vomiting probably from azotemia b. Nausea symptoms better after dialysis x1 and Zofran ODT and IV on board c. Rest as per nephrology 3. Diarrhea-recurrent a. GI path panel 10/6 neg, low suspicion for HUS b. Continue Imodium 4. Recurrent hypocalcemia- Unclear etiology 5. Probably form 2/2 hypoparthyroid-Renal losses a. Replaced with IV Ca gluconate-added tums b. Get renal panel in am 6. Severe anemia probably secondary to impending ESRD a. Hemoccult never obtained but unlikely to be GI bleed as he is not having any actual diarrhea b. Iron stores are actually paradoxically high 7. Severe TCP a. Drop in plt count down to 50's b. Have asked Oncology to see patient in morning with regards to work up c. PLT smear is pending 8. Nausea vomiting anorexia a. From renal issues, slight improved 9. BMI 21  DVT prophylaxis: Lovenox Code Status: Full presumed Family Communication: None at bedside--met GF on 10/6 Disposition:   Status is: Inpatient  Remains inpatient appropriate  because:Hemodynamically unstable, Persistent severe electrolyte disturbances, Unsafe d/c plan and IV treatments appropriate due to intensity of illness or inability to take PO  Dispo: The patient is from: Home              Anticipated d/c is to: Home              Anticipated d/c date is: > 3 days              Patient currently is not medically stable to d/c.  Consultants:   Nephrology  Procedures: None  Antimicrobials: None   Subjective:  Awake alert no distress Bleeding taken care of by IR 10/10 Met with dad at bedside--long discussion re: possible etiologies  Objective: Vitals:   06/24/20 1807 06/24/20 1951 06/25/20 0502 06/25/20 0857  BP: (!) 166/101 (!) 158/106 (!) 173/98 (!) 140/94  Pulse: 90 87 83 78  Resp: _0 Temp: 98.4 F (36.9 C) 98.7 F (37.1 C) 99.3 F (37.4 C) 98.3 F (36.8 C)  TempSrc: Oral Oral Oral Oral  SpO2: 100% 100% 100% 98%  Weight: 74.1 kg 74.1 kg    Height:        Intake/Output Summary (Last 24 hours) at 06/25/2020 1433 Last data filed at 06/25/2020 0502 Gross per 24 hour  Intake 0 ml  Output 536 ml  Net -536 ml   Filed Weights   06/24/20 1530 06/24/20 1807 06/24/20 1951  Weight: 74.2 kg 74.1 kg 74.1 kg    Examination:  General exam: Awake alert no distress Respiratory system: Clear no added sound  rales rhonchi The dialysis access area is clean and not bleeding now Cardiovascular system: S1-S2 no murmur rub or gallop Gastrointestinal system: Soft nontender no rebound no guarding.  Data Reviewed: I have personally reviewed following labs and imaging studies  PLT 96-->84-->54 Hemoglobin 7.1 Wbc 3.7 Bun/creat 40/11.35  Radiology Studies: IR Fluoro Guide CV Line Right  Result Date: 06/23/2020 INDICATION: 28 year old male with end-stage renal disease requiring dialysis. EXAM: TUNNELED PICC LINE WITH ULTRASOUND AND FLUOROSCOPIC GUIDANCE MEDICATIONS: Ancef IV. The antibiotic was given in an appropriate time interval prior  to skin puncture. ANESTHESIA/SEDATION: Versed 2 mg IV; Fentanyl 75 mcg IV; Moderate Sedation Time:  23 minutes The patient was continuously monitored during the procedure by the interventional radiology nurse under my direct supervision. FLUOROSCOPY TIME:  Fluoroscopy Time: 0 minutes 42 seconds ( mGy). COMPLICATIONS: None immediate. PROCEDURE: Informed written consent was obtained from the patient after a discussion of the risks, benefits, and alternatives to treatment. Questions regarding the procedure were encouraged and answered. The right neck and chest were prepped with chlorhexidine in a sterile fashion, and a sterile drape was applied covering the operative field. Maximum barrier sterile technique with sterile gowns and gloves were used for the procedure. A timeout was performed prior to the initiation of the procedure. After creating a small venotomy incision, a micropuncture kit was utilized to access the right internal jugular vein under direct, real-time ultrasound guidance after the overlying soft tissues were anesthetized with 1% lidocaine with epinephrine. Ultrasound image documentation was performed. The microwire was kinked to measure appropriate catheter length. The micropuncture sheath was exchanged for a peel-away sheath over a guidewire. A 14 French dual lumen tunneled dialysis catheter measuring 23 cm tip to cuff cm was tunneled in a retrograde fashion from the anterior chest wall to the venotomy incision. The catheter was then placed through the peel-away sheath with tip ultimately positioned at the superior caval-atrial junction. Final catheter positioning was confirmed and documented with a spot radiographic image. The catheter aspirates and flushes normally. The catheter was flushed with appropriate volume heparin dwells. The catheter exit site was secured with a 0-Prolene retention suture. The venotomy incision was closed with Dermabond. Dressings were applied. The patient tolerated the  procedure well without immediate post procedural complication. FINDINGS: After catheter placement, the tip lies within the superior cavoatrial junction. The catheter aspirates and flushes normally and is ready for immediate use. IMPRESSION: Successful placement of 23 cm tip to cuffcm dual lumen tunneled dialysis catheter catheter via the right internal jugular vein with tip terminating at the superior caval atrial junction. The catheter is ready for immediate use. Electronically Signed   By: Constance Holster M.D.   On: 06/23/2020 18:48   IR US Guide Vasc Access Right  Result Date: 06/23/2020 INDICATION: 29 year old male with end-stage renal disease requiring dialysis. EXAM: TUNNELED PICC LINE WITH ULTRASOUND AND FLUOROSCOPIC GUIDANCE MEDICATIONS: Ancef IV. The antibiotic was given in an appropriate time interval prior to skin puncture. ANESTHESIA/SEDATION: Versed 2 mg IV; Fentanyl 75 mcg IV; Moderate Sedation Time:  23 minutes The patient was continuously monitored during the procedure by the interventional radiology nurse under my direct supervision. FLUOROSCOPY TIME:  Fluoroscopy Time: 0 minutes 42 seconds ( mGy). COMPLICATIONS: None immediate. PROCEDURE: Informed written consent was obtained from the patient after a discussion of the risks, benefits, and alternatives to treatment. Questions regarding the procedure were encouraged and answered. The right neck and chest were prepped with chlorhexidine in a sterile fashion, and a sterile drape  was applied covering the operative field. Maximum barrier sterile technique with sterile gowns and gloves were used for the procedure. A timeout was performed prior to the initiation of the procedure. After creating a small venotomy incision, a micropuncture kit was utilized to access the right internal jugular vein under direct, real-time ultrasound guidance after the overlying soft tissues were anesthetized with 1% lidocaine with epinephrine. Ultrasound image  documentation was performed. The microwire was kinked to measure appropriate catheter length. The micropuncture sheath was exchanged for a peel-away sheath over a guidewire. A 14 French dual lumen tunneled dialysis catheter measuring 23 cm tip to cuff cm was tunneled in a retrograde fashion from the anterior chest wall to the venotomy incision. The catheter was then placed through the peel-away sheath with tip ultimately positioned at the superior caval-atrial junction. Final catheter positioning was confirmed and documented with a spot radiographic image. The catheter aspirates and flushes normally. The catheter was flushed with appropriate volume heparin dwells. The catheter exit site was secured with a 0-Prolene retention suture. The venotomy incision was closed with Dermabond. Dressings were applied. The patient tolerated the procedure well without immediate post procedural complication. FINDINGS: After catheter placement, the tip lies within the superior cavoatrial junction. The catheter aspirates and flushes normally and is ready for immediate use. IMPRESSION: Successful placement of 23 cm tip to cuffcm dual lumen tunneled dialysis catheter catheter via the right internal jugular vein with tip terminating at the superior caval atrial junction. The catheter is ready for immediate use. Electronically Signed   By: Constance Holster M.D.   On: 06/23/2020 18:48   VAS [place] UPPER EXT VEIN MAPPING (PRE-OP AVF)  Result Date: 06/25/2020 UPPER EXTREMITY VEIN MAPPING  Indications: Pre Dialysis access. Limitations: superficial of veins, tissue properties Comparison Study: No prior study on file Performing Technologist: Sharion Dove RVS  Examination Guidelines: A complete evaluation includes B-mode imaging, spectral Doppler, color Doppler, and power Doppler as needed of all accessible portions of each vessel. Bilateral testing is considered an integral part of a complete examination. Limited examinations for  reoccurring indications may be performed as noted. +-----------------+-------------+----------+---------+ Right Cephalic   Diameter (cm)Depth (cm)Findings  +-----------------+-------------+----------+---------+ Prox upper arm       0.31        0.16             +-----------------+-------------+----------+---------+ Mid upper arm        0.33        0.19             +-----------------+-------------+----------+---------+ Dist upper arm       0.31        0.16   branching +-----------------+-------------+----------+---------+ Antecubital fossa    0.32        0.18             +-----------------+-------------+----------+---------+ Prox forearm         0.19        0.13   branching +-----------------+-------------+----------+---------+ Mid forearm          0.23        0.22             +-----------------+-------------+----------+---------+ Wrist                0.24        0.27             +-----------------+-------------+----------+---------+ +-----------------+-------------+----------+---------+ Left Cephalic    Diameter (cm)Depth (cm)Findings  +-----------------+-------------+----------+---------+ Prox upper arm       0.38  0.21             +-----------------+-------------+----------+---------+ Mid upper arm        0.48        0.17   branching +-----------------+-------------+----------+---------+ Dist upper arm       0.44        0.17             +-----------------+-------------+----------+---------+ Antecubital fossa    0.51        0.42             +-----------------+-------------+----------+---------+ Prox forearm         0.37        0.36             +-----------------+-------------+----------+---------+ Mid forearm          0.29        0.27   branching +-----------------+-------------+----------+---------+ Dist forearm         0.19        0.30             +-----------------+-------------+----------+---------+ Wrist                0.29         0.20             +-----------------+-------------+----------+---------+ +-----------------+-------------+----------+--------------+ Left Basilic     Diameter (cm)Depth (cm)   Findings    +-----------------+-------------+----------+--------------+ Mid upper arm        0.30        0.34       origin     +-----------------+-------------+----------+--------------+ Dist upper arm       0.30        0.26     branching    +-----------------+-------------+----------+--------------+ Antecubital fossa    0.27        0.17                  +-----------------+-------------+----------+--------------+ Prox forearm         0.35        0.20                  +-----------------+-------------+----------+--------------+ Mid forearm          0.27        0.47     branching    +-----------------+-------------+----------+--------------+ Distal forearm       0.22        0.10                  +-----------------+-------------+----------+--------------+ Wrist                                   not visualized +-----------------+-------------+----------+--------------+ *See table(s) above for measurements and observations.  Diagnosing physician: Servando Snare MD Electronically signed by Servando Snare MD on 06/25/2020 at 11:31:50 AM.    Final      Scheduled Meds: . amLODipine  5 mg Oral Daily  . calcitRIOL  0.25 mcg Oral Daily  . calcium carbonate  400 mg of elemental calcium Oral TID WC  . Chlorhexidine Gluconate Cloth  6 each Topical Q0600  . darbepoetin (ARANESP) injection - DIALYSIS  60 mcg Subcutaneous Q Sat-HD  . [START ON 07/01/2020] darbepoetin (ARANESP) injection - DIALYSIS  60 mcg Intravenous Q Sat-HD  . feeding supplement (NEPRO CARB STEADY)  237 mL Oral TID BM  . loperamide  2 mg Oral Q8H   Continuous Infusions: . sodium chloride Stopped (06/21/20  5189)  . sodium chloride    . sodium chloride       LOS: 4 days   Time spent: [place], MD [facility] To contact the attending provider between 7A-7P or the covering provider during after hours 7P-7A, please log into the web site [email or web address] and access using universal  password for that web site. If you do not have the password, please call the hospital operator.  06/25/2020, 2:33 PM

## 2020-06-25 NOTE — Plan of Care (Signed)
  Problem: Education: Goal: Knowledge of General Education information will improve Description: Including pain rating scale, medication(s)/side effects and non-pharmacologic comfort measures Outcome: Completed/Met   Problem: Clinical Measurements: Goal: Diagnostic test results will improve Outcome: Completed/Met Goal: Respiratory complications will improve Outcome: Completed/Met Goal: Cardiovascular complication will be avoided Outcome: Completed/Met   Problem: Safety: Goal: Ability to remain free from injury will improve Outcome: Completed/Met   Problem: Skin Integrity: Goal: Risk for impaired skin integrity will decrease Outcome: Completed/Met   Problem: Clinical Measurements: Goal: Dialysis access will remain free of complications Outcome: Completed/Met

## 2020-06-25 NOTE — Progress Notes (Signed)
[facility] Progress Note   Subjective:   S/p HD #2 yesterday.  Oozing from catheter required additional suture last PM.  Plans for AVF Tues.  NO new issues.  Objective Vitals:   06/24/20 1807 06/24/20 1951 06/25/20 0502 06/25/20 0857  BP: (!) 166/101 (!) 158/106 (!) 173/98 (!) 140/94  Pulse: 90 87 83 78  Resp: $Remo'13 18 18 18  'KlDMy$ Temp: 98.4 F (36.9 C) 98.7 F (37.1 C) 99.3 F (37.4 C) 98.3 F (36.8 C)  TempSrc: Oral Oral Oral Oral  SpO2: 100% 100% 100% 98%  Weight: 74.1 kg 74.1 kg    Height:       Physical Exam General: comfortable Heart: RRR Lungs:clear Extremities: no edema Neuro: nonfocal Psych:  Normal mood and affect in circumstances Dialysis access:  [facility] with dressing c/d/i  Additional Objective Labs: Basic Metabolic Panel: Recent Labs  Lab 06/21/20 1427 06/22/20 0052 06/23/20 0249 06/24/20 0603 06/25/20 0105  NA 142   < > 140 140 140  K 4.4   < > 3.7 3.4* 3.8  CL 110   < > 103 104 103  CO2 15*   < > 20* 22 28  GLUCOSE 80   < > 99 96 94  BUN 123*   < > 124* 73* 40*  CREATININE 22.42*   < > 22.61* 15.78* 11.35*  CALCIUM 6.3*  6.2   < > 6.4* 6.9* 7.1*  PHOS 10.3*  --   --   --  4.4   < > = values in this interval not displayed.   Liver Function Tests: Recent Labs  Lab 06/22/20 0052 06/22/20 0052 06/23/20 0249 06/24/20 0603 06/25/20 0105  AST 11*  --  13* 12*  --   ALT 15  --  17 13  --   ALKPHOS 28*  --  31* 28*  --   BILITOT 0.9  --  0.8 0.8  --   PROT 5.2*  --  5.8* 5.7*  --   ALBUMIN 3.3*   < > 3.5 3.5 3.1*   < > = values in this interval not displayed.   No results for input(s): LIPASE, AMYLASE in the last 168 hours. CBC: Recent Labs  Lab 06/21/20 1427 06/21/20 1427 06/22/20 0052 06/22/20 0052 06/24/20 0603 06/24/20 2218 06/25/20 0105  WBC 2.7*   < > 4.1   < > 4.2 3.5* 3.7*  NEUTROABS 1.4*  --  2.4  --  3.1  --   --   HGB 7.0*   < > 7.5*   < > 7.9* 7.3* 7.1*  HCT 20.9*   < > 22.0*   < > 23.9* 22.2* 21.5*  MCV  92.5  --  92.4  --  91.2 91.0 91.5  PLT 96*   < > 96*   < > 84* 53* 54*   < > = values in this interval not displayed.   Blood Culture No results found for: SDES, SPECREQUEST, CULT, REPTSTATUS  Cardiac Enzymes: No results for input(s): CKTOTAL, CKMB, CKMBINDEX, TROPONINI in the last 168 hours. CBG: No results for input(s): GLUCAP in the last 168 hours. Iron Studies:  No results for input(s): IRON, TIBC, TRANSFERRIN, FERRITIN in the last 72 hours. $RemoveB'@[email or web address]'[id number]$ @ Studies/Results: IR Fluoro Guide CV Line Right  Result Date: 06/23/2020 INDICATION: 29 year old male with end-stage renal disease requiring dialysis. EXAM: TUNNELED PICC LINE WITH ULTRASOUND AND FLUOROSCOPIC GUIDANCE MEDICATIONS: Ancef IV. The antibiotic was given in an appropriate time interval prior to skin puncture. ANESTHESIA/SEDATION: Versed  2 mg IV; Fentanyl 75 mcg IV; Moderate Sedation Time:  23 minutes The patient was continuously monitored during the procedure by the interventional radiology nurse under my direct supervision. FLUOROSCOPY TIME:  Fluoroscopy Time: 0 minutes 42 seconds ( mGy). COMPLICATIONS: None immediate. PROCEDURE: Informed written consent was obtained from the patient after a discussion of the risks, benefits, and alternatives to treatment. Questions regarding the procedure were encouraged and answered. The right neck and chest were prepped with chlorhexidine in a sterile fashion, and a sterile drape was applied covering the operative field. Maximum barrier sterile technique with sterile gowns and gloves were used for the procedure. A timeout was performed prior to the initiation of the procedure. After creating a small venotomy incision, a micropuncture kit was utilized to access the right internal jugular vein under direct, real-time ultrasound guidance after the overlying soft tissues were anesthetized with 1% lidocaine with epinephrine. Ultrasound image documentation was performed. The microwire was kinked to  measure appropriate catheter length. The micropuncture sheath was exchanged for a peel-away sheath over a guidewire. A 14 French dual lumen tunneled dialysis catheter measuring 23 cm tip to cuff cm was tunneled in a retrograde fashion from the anterior chest wall to the venotomy incision. The catheter was then placed through the peel-away sheath with tip ultimately positioned at the superior caval-atrial junction. Final catheter positioning was confirmed and documented with a spot radiographic image. The catheter aspirates and flushes normally. The catheter was flushed with appropriate volume heparin dwells. The catheter exit site was secured with a 0-Prolene retention suture. The venotomy incision was closed with Dermabond. Dressings were applied. The patient tolerated the procedure well without immediate post procedural complication. FINDINGS: After catheter placement, the tip lies within the superior cavoatrial junction. The catheter aspirates and flushes normally and is ready for immediate use. IMPRESSION: Successful placement of 23 cm tip to cuffcm dual lumen tunneled dialysis catheter catheter via the right internal jugular vein with tip terminating at the superior caval atrial junction. The catheter is ready for immediate use. Electronically Signed   By: Constance Holster M.D.   On: 06/23/2020 18:48   IR US Guide Vasc Access Right  Result Date: 06/23/2020 INDICATION: 29 year old male with end-stage renal disease requiring dialysis. EXAM: TUNNELED PICC LINE WITH ULTRASOUND AND FLUOROSCOPIC GUIDANCE MEDICATIONS: Ancef IV. The antibiotic was given in an appropriate time interval prior to skin puncture. ANESTHESIA/SEDATION: Versed 2 mg IV; Fentanyl 75 mcg IV; Moderate Sedation Time:  23 minutes The patient was continuously monitored during the procedure by the interventional radiology nurse under my direct supervision. FLUOROSCOPY TIME:  Fluoroscopy Time: 0 minutes 42 seconds ( mGy). COMPLICATIONS: None  immediate. PROCEDURE: Informed written consent was obtained from the patient after a discussion of the risks, benefits, and alternatives to treatment. Questions regarding the procedure were encouraged and answered. The right neck and chest were prepped with chlorhexidine in a sterile fashion, and a sterile drape was applied covering the operative field. Maximum barrier sterile technique with sterile gowns and gloves were used for the procedure. A timeout was performed prior to the initiation of the procedure. After creating a small venotomy incision, a micropuncture kit was utilized to access the right internal jugular vein under direct, real-time ultrasound guidance after the overlying soft tissues were anesthetized with 1% lidocaine with epinephrine. Ultrasound image documentation was performed. The microwire was kinked to measure appropriate catheter length. The micropuncture sheath was exchanged for a peel-away sheath over a guidewire. A 14 French dual lumen tunneled  dialysis catheter measuring 23 cm tip to cuff cm was tunneled in a retrograde fashion from the anterior chest wall to the venotomy incision. The catheter was then placed through the peel-away sheath with tip ultimately positioned at the superior caval-atrial junction. Final catheter positioning was confirmed and documented with a spot radiographic image. The catheter aspirates and flushes normally. The catheter was flushed with appropriate volume heparin dwells. The catheter exit site was secured with a 0-Prolene retention suture. The venotomy incision was closed with Dermabond. Dressings were applied. The patient tolerated the procedure well without immediate post procedural complication. FINDINGS: After catheter placement, the tip lies within the superior cavoatrial junction. The catheter aspirates and flushes normally and is ready for immediate use. IMPRESSION: Successful placement of 23 cm tip to cuffcm dual lumen tunneled dialysis catheter  catheter via the right internal jugular vein with tip terminating at the superior caval atrial junction. The catheter is ready for immediate use. Electronically Signed   By: Constance Holster M.D.   On: 06/23/2020 18:48   VAS [place] UPPER EXT VEIN MAPPING (PRE-OP AVF)  Result Date: 06/25/2020 UPPER EXTREMITY VEIN MAPPING  Indications: Pre Dialysis access. Limitations: superficial of veins, tissue properties Comparison Study: No prior study on file Performing Technologist: Sharion Dove RVS  Examination Guidelines: A complete evaluation includes B-mode imaging, spectral Doppler, color Doppler, and power Doppler as needed of all accessible portions of each vessel. Bilateral testing is considered an integral part of a complete examination. Limited examinations for reoccurring indications may be performed as noted. +-----------------+-------------+----------+---------+ Right Cephalic   Diameter (cm)Depth (cm)Findings  +-----------------+-------------+----------+---------+ Prox upper arm       0.31        0.16             +-----------------+-------------+----------+---------+ Mid upper arm        0.33        0.19             +-----------------+-------------+----------+---------+ Dist upper arm       0.31        0.16   branching +-----------------+-------------+----------+---------+ Antecubital fossa    0.32        0.18             +-----------------+-------------+----------+---------+ Prox forearm         0.19        0.13   branching +-----------------+-------------+----------+---------+ Mid forearm          0.23        0.22             +-----------------+-------------+----------+---------+ Wrist                0.24        0.27             +-----------------+-------------+----------+---------+ +-----------------+-------------+----------+---------+ Left Cephalic    Diameter (cm)Depth (cm)Findings  +-----------------+-------------+----------+---------+ Prox upper arm        0.38        0.21             +-----------------+-------------+----------+---------+ Mid upper arm        0.48        0.17   branching +-----------------+-------------+----------+---------+ Dist upper arm       0.44        0.17             +-----------------+-------------+----------+---------+ Antecubital fossa    0.51        0.42             +-----------------+-------------+----------+---------+  Prox forearm         0.37        0.36             +-----------------+-------------+----------+---------+ Mid forearm          0.29        0.27   branching +-----------------+-------------+----------+---------+ Dist forearm         0.19        0.30             +-----------------+-------------+----------+---------+ Wrist                0.29        0.20             +-----------------+-------------+----------+---------+ +-----------------+-------------+----------+--------------+ Left Basilic     Diameter (cm)Depth (cm)   Findings    +-----------------+-------------+----------+--------------+ Mid upper arm        0.30        0.34       origin     +-----------------+-------------+----------+--------------+ Dist upper arm       0.30        0.26     branching    +-----------------+-------------+----------+--------------+ Antecubital fossa    0.27        0.17                  +-----------------+-------------+----------+--------------+ Prox forearm         0.35        0.20                  +-----------------+-------------+----------+--------------+ Mid forearm          0.27        0.47     branching    +-----------------+-------------+----------+--------------+ Distal forearm       0.22        0.10                  +-----------------+-------------+----------+--------------+ Wrist                                   not visualized +-----------------+-------------+----------+--------------+ *See table(s) above for measurements and observations.  Diagnosing  physician: Servando Snare MD Electronically signed by Servando Snare MD on 06/25/2020 at 11:31:50 AM.    Final    Medications: . sodium chloride Stopped (06/21/20 0509)  . sodium chloride    . sodium chloride     . amLODipine  5 mg Oral Daily  . calcium carbonate  400 mg of elemental calcium Oral TID WC  . Chlorhexidine Gluconate Cloth  6 each Topical Q0600  . darbepoetin (ARANESP) injection - DIALYSIS  60 mcg Subcutaneous Q Sat-HD  . [START ON 07/01/2020] darbepoetin (ARANESP) injection - DIALYSIS  60 mcg Intravenous Q Sat-HD  . feeding supplement (NEPRO CARB STEADY)  237 mL Oral TID BM  . loperamide  2 mg Oral Q8H  . sodium bicarbonate  650 mg Oral TID    Assessment/Plan: **ESRD:  I suspect this represents ESRD given labs and appearance of kidneys on [place] with diffuse echogenicity, small, atrophic appearance.  He has general serologic evaluation in process --> suspect it will be bland but will follow; less important now as this is likely ESRD but will be important for future transplant.  [place] placement 10/8 with HD #1 overnight, HD #2 10/10, #3 10/11.  VVS appreciated - will place perm access Tues, plan pending vein mapping. Receiving education re:  ESRD, diet, etc.  We discussed transplant process this week.   CLIP underway.  **HTN:  Initiated amlodipine 5mg  daily.  Having some pain overnight so maintain current dose as I suspect HTN is pain related.  **Pancytopenia:  Anemia expected with CKD but not pancytopenia.  Iron ok, will initiate ESA. HIV neg.  R/o myeloma.  I d/w hospitalist - will consult heme.   **Metabolic acidosis: d/c po bicarb now that dialyzing.  **N/V/diarrhea:  Suspect mostly uremia; Gi pathogen panel neg..  **secondary hyperPTH: concomitant hypoCa.  PTH 479.  Start calcitriol 0.25 daily.  On ca carbonate TIDAC for binder --> would change to nonca based binder long term but for now in context of hypoCa cont.   Dispo: pending initiation of dialysis and arrangement of outpt  HD.    Jannifer Hick MD 06/25/2020, 12:32 PM  [facility] Pager: [phone]

## 2020-06-26 ENCOUNTER — Encounter (HOSPITAL_COMMUNITY): Payer: Self-pay | Admitting: Internal Medicine

## 2020-06-26 DIAGNOSIS — D61818 Other pancytopenia: Secondary | ICD-10-CM

## 2020-06-26 LAB — CBC
HCT: 20.9 % — ABNORMAL LOW (ref 39.0–52.0)
Hemoglobin: 6.9 g/dL — CL (ref 13.0–17.0)
MCH: 31.2 pg (ref 26.0–34.0)
MCHC: 33 g/dL (ref 30.0–36.0)
MCV: 94.6 fL (ref 80.0–100.0)
Platelets: 52 10*3/uL — ABNORMAL LOW (ref 150–400)
RBC: 2.21 MIL/uL — ABNORMAL LOW (ref 4.22–5.81)
RDW: 13.9 % (ref 11.5–15.5)
WBC: 4.3 10*3/uL (ref 4.0–10.5)
nRBC: 0 % (ref 0.0–0.2)

## 2020-06-26 LAB — RENAL FUNCTION PANEL
Albumin: 3.3 g/dL — ABNORMAL LOW (ref 3.5–5.0)
Anion gap: 12 (ref 5–15)
BUN: 49 mg/dL — ABNORMAL HIGH (ref 6–20)
CO2: 25 mmol/L (ref 22–32)
Calcium: 7.9 mg/dL — ABNORMAL LOW (ref 8.9–10.3)
Chloride: 104 mmol/L (ref 98–111)
Creatinine, Ser: 13.98 mg/dL — ABNORMAL HIGH (ref 0.61–1.24)
GFR, Estimated: 4 mL/min — ABNORMAL LOW (ref >60)
Glucose, Bld: 110 mg/dL — ABNORMAL HIGH (ref 70–99)
Phosphorus: 6.3 mg/dL — ABNORMAL HIGH (ref 2.5–4.6)
Potassium: 4.4 mmol/L (ref 3.5–5.1)
Sodium: 141 mmol/L (ref 135–145)

## 2020-06-26 LAB — DIC (DISSEMINATED INTRAVASCULAR COAGULATION)PANEL
D-Dimer, Quant: 2.9 ug/mL-FEU — ABNORMAL HIGH (ref 0.00–0.50)
Fibrinogen: 307 mg/dL (ref 210–475)
INR: 1.1 (ref 0.8–1.2)
Platelets: 44 10*3/uL — ABNORMAL LOW (ref 150–400)
Prothrombin Time: 14.2 seconds (ref 11.4–15.2)
Smear Review: NONE SEEN
aPTT: 26 seconds (ref 24–36)

## 2020-06-26 LAB — PREPARE RBC (CROSSMATCH)

## 2020-06-26 LAB — RETICULOCYTES
Immature Retic Fract: 6.6 % (ref 2.3–15.9)
RBC.: 2.72 MIL/uL — ABNORMAL LOW (ref 4.22–5.81)
Retic Count, Absolute: 29.6 10*3/uL (ref 19.0–186.0)
Retic Ct Pct: 1.1 % (ref 0.4–3.1)

## 2020-06-26 LAB — DIRECT ANTIGLOBULIN TEST (NOT AT ARMC)
DAT, IgG: NEGATIVE
DAT, complement: NEGATIVE

## 2020-06-26 LAB — SAVE SMEAR(SSMR), FOR PROVIDER SLIDE REVIEW

## 2020-06-26 LAB — VITAMIN B12: Vitamin B-12: 597 pg/mL (ref 180–914)

## 2020-06-26 LAB — LACTATE DEHYDROGENASE: LDH: 226 U/L — ABNORMAL HIGH (ref 98–192)

## 2020-06-26 MED ORDER — SODIUM CHLORIDE 0.9 % IV SOLN
20.0000 ug | Freq: Once | INTRAVENOUS | Status: DC
  Filled 2020-06-26: qty 5

## 2020-06-26 MED ORDER — SODIUM CHLORIDE 0.9% IV SOLUTION: Freq: Once | INTRAVENOUS | Status: AC

## 2020-06-26 MED ORDER — DESMOPRESSIN ACETATE 1.5 MG/ML NA SOLN: 1.0000 | Freq: Every day | NASAL | Status: DC

## 2020-06-26 MED ORDER — HEPARIN SODIUM (PORCINE) 1000 UNIT/ML IJ SOLN
INTRAMUSCULAR | Status: AC
  Administered 2020-06-26: 3800 [IU] via INTRAVENOUS_CENTRAL
  Filled 2020-06-26: qty 4

## 2020-06-26 MED ORDER — CEFAZOLIN SODIUM-DEXTROSE 1-4 GM/50ML-% IV SOLN
1.0000 g | INTRAVENOUS | Status: AC
  Administered 2020-06-27: 2 g via INTRAVENOUS
  Filled 2020-06-26 (×2): qty 50

## 2020-06-26 NOTE — Progress Notes (Signed)
Referring Physician(s): Dr. Joelyn Oms  Supervising Physician: Aletta Edouard  Patient Status:  [facility] - In-pt  Chief Complaint: Renal failure  Subjective: Continues with slow oozing from catheter tract.  Per MAR, was given 3800u heparin with dialysis today.  Platelet count 54 BP 155/106  Allergies: Patient has no known allergies.  Medications: Prior to Admission medications   Not on File     Vital Signs: BP (!) 155/106   Pulse 89   Temp 100 F (37.8 C) (Oral)   Resp 18   Ht _0  (1.88 m)   Wt 159 lb 2.8 oz (72.2 kg)   SpO2 100%   BMI 20.44 kg/m   Physical Exam  NAD, alert Chest: R IJ dialysis catheter in place.  Bloody dressing covering the catheter.  Removal of catheter shows slow pooling of blood along the insertion site. No oozing at stitch site. Bandage on neck clean and dry.   Imaging: IR Fluoro Guide CV Line Right  Result Date: 06/23/2020 INDICATION: 29 year old male with end-stage renal disease requiring dialysis. EXAM: TUNNELED PICC LINE WITH ULTRASOUND AND FLUOROSCOPIC GUIDANCE MEDICATIONS: Ancef IV. The antibiotic was given in an appropriate time interval prior to skin puncture. ANESTHESIA/SEDATION: Versed 2 mg IV; Fentanyl 75 mcg IV; Moderate Sedation Time:  23 minutes The patient was continuously monitored during the procedure by the interventional radiology nurse under my direct supervision. FLUOROSCOPY TIME:  Fluoroscopy Time: 0 minutes 42 seconds ( mGy). COMPLICATIONS: None immediate. PROCEDURE: Informed written consent was obtained from the patient after a discussion of the risks, benefits, and alternatives to treatment. Questions regarding the procedure were encouraged and answered. The right neck and chest were prepped with chlorhexidine in a sterile fashion, and a sterile drape was applied covering the operative field. Maximum barrier sterile technique with sterile gowns and gloves were used for the procedure. A timeout was performed prior to the  initiation of the procedure. After creating a small venotomy incision, a micropuncture kit was utilized to access the right internal jugular vein under direct, real-time ultrasound guidance after the overlying soft tissues were anesthetized with 1% lidocaine with epinephrine. Ultrasound image documentation was performed. The microwire was kinked to measure appropriate catheter length. The micropuncture sheath was exchanged for a peel-away sheath over a guidewire. A 14 French dual lumen tunneled dialysis catheter measuring 23 cm tip to cuff cm was tunneled in a retrograde fashion from the anterior chest wall to the venotomy incision. The catheter was then placed through the peel-away sheath with tip ultimately positioned at the superior caval-atrial junction. Final catheter positioning was confirmed and documented with a spot radiographic image. The catheter aspirates and flushes normally. The catheter was flushed with appropriate volume heparin dwells. The catheter exit site was secured with a 0-Prolene retention suture. The venotomy incision was closed with Dermabond. Dressings were applied. The patient tolerated the procedure well without immediate post procedural complication. FINDINGS: After catheter placement, the tip lies within the superior cavoatrial junction. The catheter aspirates and flushes normally and is ready for immediate use. IMPRESSION: Successful placement of 23 cm tip to cuffcm dual lumen tunneled dialysis catheter catheter via the right internal jugular vein with tip terminating at the superior caval atrial junction. The catheter is ready for immediate use. Electronically Signed   By: Constance Holster M.D.   On: 06/23/2020 18:48   IR US Guide Vasc Access Right  Result Date: 06/23/2020 INDICATION: 29 year old male with end-stage renal disease requiring dialysis. EXAM: TUNNELED PICC LINE WITH ULTRASOUND  AND FLUOROSCOPIC GUIDANCE MEDICATIONS: Ancef IV. The antibiotic was given in an  appropriate time interval prior to skin puncture. ANESTHESIA/SEDATION: Versed 2 mg IV; Fentanyl 75 mcg IV; Moderate Sedation Time:  23 minutes The patient was continuously monitored during the procedure by the interventional radiology nurse under my direct supervision. FLUOROSCOPY TIME:  Fluoroscopy Time: 0 minutes 42 seconds ( mGy). COMPLICATIONS: None immediate. PROCEDURE: Informed written consent was obtained from the patient after a discussion of the risks, benefits, and alternatives to treatment. Questions regarding the procedure were encouraged and answered. The right neck and chest were prepped with chlorhexidine in a sterile fashion, and a sterile drape was applied covering the operative field. Maximum barrier sterile technique with sterile gowns and gloves were used for the procedure. A timeout was performed prior to the initiation of the procedure. After creating a small venotomy incision, a micropuncture kit was utilized to access the right internal jugular vein under direct, real-time ultrasound guidance after the overlying soft tissues were anesthetized with 1% lidocaine with epinephrine. Ultrasound image documentation was performed. The microwire was kinked to measure appropriate catheter length. The micropuncture sheath was exchanged for a peel-away sheath over a guidewire. A 14 French dual lumen tunneled dialysis catheter measuring 23 cm tip to cuff cm was tunneled in a retrograde fashion from the anterior chest wall to the venotomy incision. The catheter was then placed through the peel-away sheath with tip ultimately positioned at the superior caval-atrial junction. Final catheter positioning was confirmed and documented with a spot radiographic image. The catheter aspirates and flushes normally. The catheter was flushed with appropriate volume heparin dwells. The catheter exit site was secured with a 0-Prolene retention suture. The venotomy incision was closed with Dermabond. Dressings were  applied. The patient tolerated the procedure well without immediate post procedural complication. FINDINGS: After catheter placement, the tip lies within the superior cavoatrial junction. The catheter aspirates and flushes normally and is ready for immediate use. IMPRESSION: Successful placement of 23 cm tip to cuffcm dual lumen tunneled dialysis catheter catheter via the right internal jugular vein with tip terminating at the superior caval atrial junction. The catheter is ready for immediate use. Electronically Signed   By: Constance Holster M.D.   On: 06/23/2020 18:48   VAS [place] UPPER EXT VEIN MAPPING (PRE-OP AVF)  Result Date: 06/25/2020 UPPER EXTREMITY VEIN MAPPING  Indications: Pre Dialysis access. Limitations: superficial of veins, tissue properties Comparison Study: No prior study on file Performing Technologist: Sharion Dove RVS  Examination Guidelines: A complete evaluation includes B-mode imaging, spectral Doppler, color Doppler, and power Doppler as needed of all accessible portions of each vessel. Bilateral testing is considered an integral part of a complete examination. Limited examinations for reoccurring indications may be performed as noted. +-----------------+-------------+----------+---------+ Right Cephalic   Diameter (cm)Depth (cm)Findings  +-----------------+-------------+----------+---------+ Prox upper arm       0.31        0.16             +-----------------+-------------+----------+---------+ Mid upper arm        0.33        0.19             +-----------------+-------------+----------+---------+ Dist upper arm       0.31        0.16   branching +-----------------+-------------+----------+---------+ Antecubital fossa    0.32        0.18             +-----------------+-------------+----------+---------+ Prox forearm  0.19        0.13   branching +-----------------+-------------+----------+---------+ Mid forearm          0.23        0.22              +-----------------+-------------+----------+---------+ Wrist                0.24        0.27             +-----------------+-------------+----------+---------+ +-----------------+-------------+----------+---------+ Left Cephalic    Diameter (cm)Depth (cm)Findings  +-----------------+-------------+----------+---------+ Prox upper arm       0.38        0.21             +-----------------+-------------+----------+---------+ Mid upper arm        0.48        0.17   branching +-----------------+-------------+----------+---------+ Dist upper arm       0.44        0.17             +-----------------+-------------+----------+---------+ Antecubital fossa    0.51        0.42             +-----------------+-------------+----------+---------+ Prox forearm         0.37        0.36             +-----------------+-------------+----------+---------+ Mid forearm          0.29        0.27   branching +-----------------+-------------+----------+---------+ Dist forearm         0.19        0.30             +-----------------+-------------+----------+---------+ Wrist                0.29        0.20             +-----------------+-------------+----------+---------+ +-----------------+-------------+----------+--------------+ Left Basilic     Diameter (cm)Depth (cm)   Findings    +-----------------+-------------+----------+--------------+ Mid upper arm        0.30        0.34       origin     +-----------------+-------------+----------+--------------+ Dist upper arm       0.30        0.26     branching    +-----------------+-------------+----------+--------------+ Antecubital fossa    0.27        0.17                  +-----------------+-------------+----------+--------------+ Prox forearm         0.35        0.20                  +-----------------+-------------+----------+--------------+ Mid forearm          0.27        0.47     branching     +-----------------+-------------+----------+--------------+ Distal forearm       0.22        0.10                  +-----------------+-------------+----------+--------------+ Wrist                                   not visualized +-----------------+-------------+----------+--------------+ *See table(s) above for measurements and observations.  Diagnosing physician: Servando Snare MD Electronically signed by Servando Snare MD  on 06/25/2020 at 11:31:50 AM.    Final     Labs:  CBC: Recent Labs    06/24/20 0603 06/24/20 2218 06/25/20 0105 06/26/20 0804  WBC 4.2 3.5* 3.7* 4.3  HGB 7.9* 7.3* 7.1* 6.9*  HCT 23.9* 22.2* 21.5* 20.9*  PLT 84* 53* 54* 52*    COAGS: No results for input(s): INR, APTT in the last 8760 hours.  BMP: Recent Labs    06/23/20 0249 06/24/20 0603 06/25/20 0105 06/26/20 0804  NA 140 140 140 141  K 3.7 3.4* 3.8 4.4  CL 103 104 103 104  CO2 20* _0 GLUCOSE 99 96 94 110*  BUN 124* 73* 40* 49*  CALCIUM 6.4* 6.9* 7.1* 7.9*  CREATININE 22.61* 15.78* 11.35* 13.98*  GFRNONAA 2* 4* 5* 4*    LIVER FUNCTION TESTS: Recent Labs    06/22/20 0052 06/22/20 0052 06/23/20 0249 06/24/20 0603 06/25/20 0105 06/26/20 0804  BILITOT 0.9  --  0.8 0.8  --   --   AST 11*  --  13* 12*  --   --   ALT 15  --  17 13  --   --   ALKPHOS 28*  --  31* 28*  --   --   PROT 5.2*  --  5.8* 5.7*  --   --   ALBUMIN 3.3*   < > 3.5 3.5 3.1* 3.3*   < > = values in this interval not displayed.    Assessment and Plan: End stage renal disease, renal failure s/p tunneled HD catheter placement 06/23/20 Patient has experienced persistent oozing from his catheter tract.  A purse-string suture was applied by Dr. Vernard Gambles over the weekend without significant relief of oozing likely due to elevated BP, low platelet count. Could consider DDAVP if ok with primary team.  Discussed with Dr. Verlon Au.  PA to bedside this afternoon.  New quik-clot dressing applied.  Pressure dressing applied.    Minimize heparin use during dialysis-- eliminate if at all possible.  Instructed patient to sit upright in bed as much as possible to reduce venous pressure.  Please call if IR can be of further assistance.   Electronically Signed: Docia Barrier, PA 06/26/2020, 4:26 PM   I spent a total of 15 Minutes at the the patient's bedside AND on the patient's hospital floor or unit, greater than 50% of which was counseling/coordinating care for renal failure.

## 2020-06-26 NOTE — Plan of Care (Signed)
  Problem: Clinical Measurements: Goal: Complications related to the disease process or treatment will be avoided or minimized Outcome: Progressing   Problem: Urinary Elimination: Goal: Progression of disease will be identified and treated Outcome: Progressing

## 2020-06-26 NOTE — Progress Notes (Signed)
Pt for left arm access tomorrow with Dr. Donnetta Hutching.  Pt has pre-op orders placed.  Npo after MN/consent/labs.   Leontine Locket, [facility] 06/26/2020 7:57 AM

## 2020-06-26 NOTE — Progress Notes (Signed)
Renal Navigator is still awaiting financial clearance in order for patient to receive HD in the outpatient clinic and will continue to follow closely. Navigator notes plans for permanent access tomorrow.   Alphonzo Cruise, [facility] Nav[facility] [phone]

## 2020-06-26 NOTE — Plan of Care (Signed)
  Problem: Elimination: Goal: Will not experience complications related to bowel motility Outcome: Completed/Met Goal: Will not experience complications related to urinary retention Outcome: Completed/Met   Problem: Activity: Goal: Activity intolerance will improve Outcome: Completed/Met   Problem: Respiratory: Goal: Respiratory symptoms related to disease process will be avoided Outcome: Completed/Met   Problem: Urinary Elimination: Goal: Progression of disease will be identified and treated Outcome: Completed/Met

## 2020-06-26 NOTE — Anesthesia Preprocedure Evaluation (Addendum)
Anesthesia Evaluation  Patient identified by MRN, date of birth, ID band Patient awake    Reviewed: Allergy & Precautions, NPO status , Patient's Chart, lab work & pertinent test results  History of Anesthesia Complications Negative for: history of anesthetic complications  Airway Mallampati: II  TM Distance: >3 FB Neck ROM: Full    Dental  (+) Dental Advisory Given, Teeth Intact   Pulmonary Current Smoker and Patient abstained from smoking.,    Pulmonary exam normal        Cardiovascular hypertension (newly diagnosed), Normal cardiovascular exam     Neuro/Psych negative neurological ROS  negative psych ROS   GI/Hepatic negative GI ROS, (+)     substance abuse  marijuana use,   Endo/Other  negative endocrine ROS  Renal/GU ESRFRenal disease     Musculoskeletal negative musculoskeletal ROS (+)   Abdominal   Peds  Hematology  (+) anemia ,  Thrombocytopenia, Plt 46k     Anesthesia Other Findings Covid test negative   Reproductive/Obstetrics                           Anesthesia Physical Anesthesia Plan  ASA: IV  Anesthesia Plan: MAC   Post-op Pain Management:    Induction: Intravenous  PONV Risk Score and Plan: 1 and Propofol infusion and Treatment may vary due to age or medical condition  Airway Management Planned: Natural Airway and Simple Face Mask  Additional Equipment: None  Intra-op Plan:   Post-operative Plan:   Informed Consent: I have reviewed the patients History and Physical, chart, labs and discussed the procedure including the risks, benefits and alternatives for the proposed anesthesia with the patient or authorized representative who has indicated his/her understanding and acceptance.       Plan Discussed with: CRNA and Anesthesiologist  Anesthesia Plan Comments:        Anesthesia Quick Evaluation

## 2020-06-26 NOTE — Progress Notes (Signed)
   06/26/20 1100  Hand-Off documentation  Handoff Given Given to shift RN/LPN  Report given to (Full Name) Ren,RN  Handoff Received Received from shift RN/LPN  Report received from (Full Name) Richardo Popoff,RN  Vitals  Temp 99 F (37.2 C)  Temp Source Oral  BP (!) 150/110  BP Location Right Arm  BP Method Automatic  Patient Position (if appropriate) Lying  Pulse Rate 93  Pulse Rate Source Monitor  ECG Heart Rate 92  Resp 16  Oxygen Therapy  SpO2 99 %  O2 Device Room Air  Dialysis Weight  Weight 72.2 kg  Type of Weight Post-Dialysis  Post-Hemodialysis Assessment  Rinseback Volume (mL) 250 mL  KECN 277 V  Dialyzer Clearance Lightly streaked  Duration of HD Treatment -hour(s) 3 hour(s)  Hemodialysis Intake (mL) 500 mL  UF Total -Machine (mL) 500 mL  Net UF (mL) 0 mL  Tolerated HD Treatment Yes  Post-Hemodialysis Comments tx complete-pt stable  Hemodialysis Catheter Right Internal jugular Double lumen Permanent (Tunneled)  Placement Date/Time: 06/23/20 1604   Placed prior to admission: No  Time Out: Correct patient;Correct site;Correct procedure  Maximum sterile barrier precautions: Hand hygiene;Cap  Site Prep: Chlorhexidine (preferred);Skin Prep Completely Dry at the T...  Site Condition Drainage  Blue Lumen Status Heparin locked;Capped (Central line);Flushed  Red Lumen Status Flushed;Heparin locked;Capped (Central line)  Catheter fill solution Heparin 1000 units/ml  Catheter fill volume (Arterial) 1.9 cc  Catheter fill volume (Venous) 1.9  Dressing Type Gauze/Drain sponge;Other (Comment);Occlusive  Dressing Status Antimicrobial disc in place;New drainage  Interventions New dressing  Drainage Description  (bloody)  Dressing Change Due 07/02/20  Post treatment catheter status Capped and Clamped  HD tx complete-pt stable. Drsg changed to catheter site, previous gauze dressing saturated, bloody drainage noted from catheter site. New dressing placed. Dressing to be addressed daily  for reinforcement and/or changing due to bloody drainage. PRBC 1 unit ready in blood bank for transfusion, primary nurse made aware. Unable to transfuse due to HD tx with 17 minutes left when called.

## 2020-06-26 NOTE — Progress Notes (Signed)
PROGRESS NOTE    Brian Duffy.  TFT:[id number] DOB: 1991-05-19 DOA: 06/20/2020 PCP: Patient, [name]  Brief Narrative:  29 year old male with no past medical history other than severe loss of appetite nausea came to emergency room Found to have severe azotemia/creatinine 130/24 CO2 13 gap 16 Hemoglobin 5.6 WBC 3 platelet 111 Transfused 2 units PRBC  So far  [-] hep C hep B, ANA, GBM membrane  Going forTDC as is now comitting to Dialysis  Assessment & Plan:   Principal Problem:   AKI (acute kidney injury) ([place]) Active Problems:   Uremia   Metabolic acidosis   Normocytic anemia   Thrombocytopenia (HCC)   Protein-calorie malnutrition, severe   1. Bleeding from right chest dialysis access port a. R chest catheter re-sutured 10/10 by IR and stopped bleeding--stillhaving some oozing so floor nurse will call IR to discuss planning 2. Anemia of blood loss and renal insufficiency a. transfusing 1 U PRBC 10/11 for hemoglobin 6.4 b. RPT labs in am 3. Severe azotemia with metabolic acidosis probably from renal failure a. Patient has nausea and vomiting probably from azotemia b. Nausea symptoms better after dialysis x1 and Zofran ODT and IV on board c. Rest as per nephrology 4. Diarrhea-recurrent a. GI path panel 10/6 neg, low suspicion for HUS b. Continue Imodium 5. Recurrent hypocalcemia- Unclear etiology 6. Probably form 2/2 hypoparthyroid-Renal losses a. Replaced with IV Ca gluconate-added tums--corrected calcium however is 8.1 b. Get renal panel in am 7. Severe TCP a. Drop in plt count down to 50's b. oncologist to see c. PLT smear is pending, Myelma panel is pending 8. Nausea vomiting anorexia a. From renal issues, slight improved 9. BMI 21  DVT prophylaxis: Lovenox Code Status: Full presumed Family Communication: None at bedside--met father on 10/11 Disposition:   Status is: Inpatient  Remains inpatient appropriate because:Hemodynamically unstable,  Persistent severe electrolyte disturbances, Unsafe d/c plan and IV treatments appropriate due to intensity of illness or inability to take PO  Dispo: The patient is from: Home              Anticipated d/c is to: Home              Anticipated d/c date is: > 3 days              Patient currently is not medically stable to d/c.  Consultants:   Nephrology  Procedures: None  Antimicrobials: None   Subjective:  Coherent in nad no focal deficit Some bleeding from the HD cath again Eating some but appetite is low No cp   Objective: Vitals:   06/26/20 1102 06/26/20 1117 06/26/20 1212 06/26/20 1215  BP: (!) 171/109 (!) 172/105 (!) 162/98 (!) 162/98  Pulse: 93 94 93 93  Resp: (!) 178 $Remov'20 18 18  'UnkNyK$ Temp:  99.8 F (37.7 C) 99.8 F (37.7 C) 99.8 F (37.7 C)  TempSrc:  Oral  Oral  SpO2:  100%  100%  Weight:      Height:        Intake/Output Summary (Last 24 hours) at 06/26/2020 1355 Last data filed at 06/26/2020 1200 Gross per 24 hour  Intake 390 ml  Output 1250 ml  Net -860 ml   Filed Weights   06/24/20 1807 06/24/20 1951 06/26/20 1100  Weight: 74.1 kg 74.1 kg 72.2 kg    Examination:  General exam: Awake alert no distress Respiratory system: Clear no added sound rales rhonchi The dialysis access area is clean and not bleeding  now Cardiovascular system: S1-S2 no murmur rub or gallop Gastrointestinal system: Soft nontender no rebound no guarding.  Data Reviewed: I have personally reviewed following labs and imaging studies  PLT 96-->84-->52 Hemoglobin 6.9 Wbc 4.3 Bun/creat 49/13.9  Radiology Studies: VAS [place] UPPER EXT VEIN MAPPING (PRE-OP AVF)  Result Date: 06/25/2020 UPPER EXTREMITY VEIN MAPPING  Indications: Pre Dialysis access. Limitations: superficial of veins, tissue properties Comparison Study: No prior study on file Performing Technologist: Sharion Dove RVS  Examination Guidelines: A complete evaluation includes B-mode imaging, spectral Doppler, color  Doppler, and power Doppler as needed of all accessible portions of each vessel. Bilateral testing is considered an integral part of a complete examination. Limited examinations for reoccurring indications may be performed as noted. +-----------------+-------------+----------+---------+ Right Cephalic   Diameter (cm)Depth (cm)Findings  +-----------------+-------------+----------+---------+ Prox upper arm       0.31        0.16             +-----------------+-------------+----------+---------+ Mid upper arm        0.33        0.19             +-----------------+-------------+----------+---------+ Dist upper arm       0.31        0.16   branching +-----------------+-------------+----------+---------+ Antecubital fossa    0.32        0.18             +-----------------+-------------+----------+---------+ Prox forearm         0.19        0.13   branching +-----------------+-------------+----------+---------+ Mid forearm          0.23        0.22             +-----------------+-------------+----------+---------+ Wrist                0.24        0.27             +-----------------+-------------+----------+---------+ +-----------------+-------------+----------+---------+ Left Cephalic    Diameter (cm)Depth (cm)Findings  +-----------------+-------------+----------+---------+ Prox upper arm       0.38        0.21             +-----------------+-------------+----------+---------+ Mid upper arm        0.48        0.17   branching +-----------------+-------------+----------+---------+ Dist upper arm       0.44        0.17             +-----------------+-------------+----------+---------+ Antecubital fossa    0.51        0.42             +-----------------+-------------+----------+---------+ Prox forearm         0.37        0.36             +-----------------+-------------+----------+---------+ Mid forearm          0.29        0.27   branching  +-----------------+-------------+----------+---------+ Dist forearm         0.19        0.30             +-----------------+-------------+----------+---------+ Wrist                0.29        0.20             +-----------------+-------------+----------+---------+ +-----------------+-------------+----------+--------------+ Left Basilic  Diameter (cm)Depth (cm)   Findings    +-----------------+-------------+----------+--------------+ Mid upper arm        0.30        0.34       origin     +-----------------+-------------+----------+--------------+ Dist upper arm       0.30        0.26     branching    +-----------------+-------------+----------+--------------+ Antecubital fossa    0.27        0.17                  +-----------------+-------------+----------+--------------+ Prox forearm         0.35        0.20                  +-----------------+-------------+----------+--------------+ Mid forearm          0.27        0.47     branching    +-----------------+-------------+----------+--------------+ Distal forearm       0.22        0.10                  +-----------------+-------------+----------+--------------+ Wrist                                   not visualized +-----------------+-------------+----------+--------------+ *See table(s) above for measurements and observations.  Diagnosing physician: Servando Snare MD Electronically signed by Servando Snare MD on 06/25/2020 at 11:31:50 AM.    Final      Scheduled Meds: . amLODipine  5 mg Oral Daily  . calcitRIOL  0.25 mcg Oral Daily  . calcium carbonate  400 mg of elemental calcium Oral TID WC  . Chlorhexidine Gluconate Cloth  6 each Topical Q0600  . [START ON 07/01/2020] darbepoetin (ARANESP) injection - DIALYSIS  60 mcg Intravenous Q Sat-HD  . feeding supplement (NEPRO CARB STEADY)  237 mL Oral TID BM  . loperamide  2 mg Oral Q8H   Continuous Infusions: . sodium chloride Stopped (06/21/20 0509)  .  [START ON 06/27/2020]  ceFAZolin (ANCEF) IV       LOS: 5 days   Time spent: Bee, MD [facility] To contact the attending provider between 7A-7P or the covering provider during after hours 7P-7A, please log into the web site [email or web address] and access using universal [place] password for that web site. If you do not have the password, please call the hospital operator.  06/26/2020, 1:55 PM

## 2020-06-26 NOTE — Procedures (Signed)
I was present at this dialysis session. I have reviewed the session itself and made appropriate changes.   HD#3, L IJ TDC.  No UF, 4K bath.  Hb 6.9, give 1u PRBC, for AVF/G tomorrow LUE with VVS.  CLIP in process, wil f/u later today.    Sero: neg HIV, HBV, HCV, ANCA, GBM, ANA.  C4 WNL. C3 slightly low of unclear clinical significance.    Next HD 10/13.   ON aranesp 60 qSat, Last TSAT 74%, No IV Fe  Filed Weights   06/24/20 1530 06/24/20 1807 06/24/20 1951  Weight: 74.2 kg 74.1 kg 74.1 kg    Recent Labs  Lab 06/26/20 0804  NA 141  K 4.4  CL 104  CO2 25  GLUCOSE 110*  BUN 49*  CREATININE 13.98*  CALCIUM 7.9*  PHOS 6.3*    Recent Labs  Lab 06/21/20 1427 06/21/20 1427 06/22/20 0052 06/22/20 0052 06/24/20 0603 06/24/20 0603 06/24/20 2218 06/25/20 0105 06/26/20 0804  WBC 2.7*   < > 4.1   < > 4.2   < > 3.5* 3.7* 4.3  NEUTROABS 1.4*  --  2.4  --  3.1  --   --   --   --   HGB 7.0*   < > 7.5*   < > 7.9*   < > 7.3* 7.1* 6.9*  HCT 20.9*   < > 22.0*   < > 23.9*   < > 22.2* 21.5* 20.9*  MCV 92.5   < > 92.4   < > 91.2   < > 91.0 91.5 94.6  PLT 96*   < > 96*   < > 84*   < > 53* 54* 52*   < > = values in this interval not displayed.    Scheduled Meds: . sodium chloride   Intravenous Once  . amLODipine  5 mg Oral Daily  . calcitRIOL  0.25 mcg Oral Daily  . calcium carbonate  400 mg of elemental calcium Oral TID WC  . Chlorhexidine Gluconate Cloth  6 each Topical Q0600  . darbepoetin (ARANESP) injection - DIALYSIS  60 mcg Subcutaneous Q Sat-HD  . [START ON 07/01/2020] darbepoetin (ARANESP) injection - DIALYSIS  60 mcg Intravenous Q Sat-HD  . feeding supplement (NEPRO CARB STEADY)  237 mL Oral TID BM  . loperamide  2 mg Oral Q8H   Continuous Infusions: . sodium chloride Stopped (06/21/20 0509)  . sodium chloride    . sodium chloride    . [START ON 06/27/2020]  ceFAZolin (ANCEF) IV     PRN Meds:.sodium chloride, sodium chloride, alteplase, heparin, lidocaine (PF),  lidocaine-prilocaine, ondansetron (ZOFRAN) IV, ondansetron, oxyCODONE-acetaminophen, pentafluoroprop-tetrafluoroeth   Pearson Grippe  MD 06/26/2020, 9:20 AM

## 2020-06-26 NOTE — H&P (View-Only) (Signed)
Pt for left arm access tomorrow with Dr. Donnetta Hutching.  Pt has pre-op orders placed.  Npo after MN/consent/labs.   Leontine Locket, [facility] 06/26/2020 7:57 AM

## 2020-06-26 NOTE — Consult Note (Addendum)
[place]  [phone] [phone]) [phone]    [place]  Referring MD: Dr. Nita Sells   Reason for Referral: Anemia, thrombocytopenia and intermittent leukopenia  HPI: Brian Duffy is a 29 year old male with no significant past medical history.  He presented to the hospital with loss of appetite, nausea, vomiting, and fatigue for several months.  In the emergency room, his WBC was 3.3, hemoglobin 5.6, platelets 111,000.  He received 2 units PRBCs on admission.  He declined rectal exam and FOBT was not sent.  His BUN was 113 and creatinine was 24.4.  Renal ultrasound was performed which showed diffusely increased parenchymal echogenicity with several anechoic cyst-could be due to to medical renal disease with possible polycystic kidneys.  The patient is being followed by nephrology and is receiving hemodialysis.  He received treatment #3 today.  The patient has no prior labs available in epic or through care everywhere for comparison.  Admission, he has been having fluctuating leukopenia and WBC today is back to normal.  His hemoglobin has drifted down to 6.9 today and he is receiving 1 unit PRBCs.  Platelet count has continued to drift down this admission and is down to 52,000 today-seems to have plateaued in the low 50,000 range for the past 3 days. Of note, he received heparin starting on 10/6 and this was discontinued on 10/9. Additional labs reviewed including a vitamin B12 level which was 277, folate 6.0, ferritin 222, iron 131, TIBC 176, percent saturation 74%, reticulocyte count percent 1.1%, absolute reticulocyte count 24.0, and immature reticulocyte fraction 4.0%.  A multiple myeloma panel is pending and UPEP has been ordered.  The patient's parents are at the bedside today.  He reports that he is feeling better since prior to admission.  His GI symptoms including nausea, vomiting, diarrhea have resolved.  He reports a weight loss of  approximately 10 pounds over the past few months.  He reports that his appetite has been poor.  He currently denies headaches, dizziness, fevers, chills, night sweats.  Denies chest pain and shortness of breath but states that he has had some congestion prior to admission.  Reports some oozing from his right chest catheter but no other bleeding such as epistaxis, hemoptysis, hematemesis, hematuria, melena, hematochezia.  He reports that he is still making urine without any difficulty.  He reports that he has lower extremity edema which has now resolved.  The patient is single and has no children.  Denies alcohol use.  He reports that he smokes about 3 to 4 cigarettes/day.  Hematology was asked to see the patient to make recommendations regarding his anemia, thrombocytopenia, fluctuating leukopenia.  History reviewed. No pertinent past medical history.:    Past Surgical History:  Procedure Laterality Date  . IR FLUORO GUIDE CV LINE RIGHT  06/23/2020  . IR US GUIDE VASC ACCESS RIGHT  06/23/2020  :   CURRENT MEDS: Current Facility-Administered Medications  Medication Dose Route Frequency Provider Last Rate Last Admin  . 0.9 %  sodium chloride infusion  10 mL/hr Intravenous Once Shela Leff, MD   Stopped at 06/21/20 0509  . amLODipine (NORVASC) tablet 5 mg  5 mg Oral Daily Justin Mend, MD   5 mg at 06/26/20 1129  . calcitRIOL (ROCALTROL) capsule 0.25 mcg  0.25 mcg Oral Daily Justin Mend, MD   0.25 mcg at 06/26/20 1129  . calcium carbonate (TUMS - dosed in mg elemental calcium) chewable tablet 400 mg of elemental  calcium  400 mg of elemental calcium Oral TID WC Nita Sells, MD   400 mg of elemental calcium at 06/26/20 1130  . [START ON 06/27/2020] ceFAZolin (ANCEF) IVPB 1 g/50 mL premix  1 g Intravenous On Call Rhyne, Hulen Shouts, PA-C      . Chlorhexidine Gluconate Cloth 2 % PADS 6 each  6 each Topical Q0600 Justin Mend, MD   6 each at 06/26/20 0547  . [START ON  07/01/2020] Darbepoetin Alfa (ARANESP) injection 60 mcg  60 mcg Intravenous Q Sat-HD Nita Sells, MD      . feeding supplement (NEPRO CARB STEADY) liquid 237 mL  237 mL Oral TID BM Nita Sells, MD   237 mL at 06/24/20 2210  . loperamide (IMODIUM) capsule 2 mg  2 mg Oral Q8H Nita Sells, MD   2 mg at 06/25/20 1404  . ondansetron (ZOFRAN) injection 4 mg  4 mg Intravenous Q8H PRN Nita Sells, MD      . ondansetron (ZOFRAN-ODT) disintegrating tablet 4 mg  4 mg Oral Q8H PRN Nita Sells, MD   4 mg at 06/25/20 1406  . oxyCODONE-acetaminophen (PERCOCET/ROXICET) 5-325 MG per tablet 1 tablet  1 tablet Oral Q6H PRN Shalhoub, Sherryll Burger, MD   1 tablet at 06/23/20 2211      No Known Allergies:  History reviewed. No pertinent family history.:  Social History   Socioeconomic History  . Marital status: Single    Spouse name: Not on file  . Number of children: Not on file  . Years of education: Not on file  . Highest education level: Not on file  Occupational History  . Not on file  Tobacco Use  . Smoking status: Current Every Day Smoker    Types: Cigarettes  . Smokeless tobacco: Never Used  . Tobacco comment: 3 to 4 cigarettes daily  Substance and Sexual Activity  . Alcohol use: Not Currently  . Drug use: Yes    Types: Marijuana  . Sexual activity: Not on file  Other Topics Concern  . Not on file  Social History Narrative  . Not on file   Social Determinants of Health   Financial Resource Strain:   . Difficulty of Paying Living Expenses: Not on file  Food Insecurity:   . Worried About Charity fundraiser in the Last Year: Not on file  . Ran Out of Food in the Last Year: Not on file  Transportation Needs:   . Lack of Transportation (Medical): Not on file  . Lack of Transportation (Non-Medical): Not on file  Physical Activity:   . Days of Exercise per Week: Not on file  . Minutes of Exercise per Session: Not on file  Stress:   . Feeling of  Stress : Not on file  Social Connections:   . Frequency of Communication with Friends and Family: Not on file  . Frequency of Social Gatherings with Friends and Family: Not on file  . Attends Religious Services: Not on file  . Active Member of Clubs or Organizations: Not on file  . Attends Archivist Meetings: Not on file  . Marital Status: Not on file  Intimate Partner Violence:   . Fear of Current or Ex-Partner: Not on file  . Emotionally Abused: Not on file  . Physically Abused: Not on file  . Sexually Abused: Not on file  :  REVIEW OF SYSTEMS:  A comprehensive 14 point review of systems was negative except as noted in the HPI.  Exam: Patient Vitals for the past 24 hrs:  BP Temp Temp src Pulse Resp SpO2 Weight  06/26/20 1215 (!) 162/98 99.8 F (37.7 C) Oral 93 18 100 % --  06/26/20 1212 (!) 162/98 99.8 F (37.7 C) -- 93 18 -- --  06/26/20 1117 (!) 172/105 99.8 F (37.7 C) Oral 94 20 100 % --  06/26/20 1102 (!) 171/109 -- -- 93 (!) 178 -- --  06/26/20 1100 (!) 150/110 99 F (37.2 C) Oral 93 16 99 % 72.2 kg  06/26/20 1051 (!) 161/101 -- -- 92 16 -- --  06/26/20 1030 (!) 160/108 -- -- 92 16 -- --  06/26/20 1000 (!) 160/105 -- -- -- -- -- --  06/26/20 0930 (!) 152/104 -- -- -- 16 -- --  06/26/20 0900 (!) 153/108 -- -- -- 14 -- --  06/26/20 0830 (!) 164/107 -- -- -- 16 -- --  06/26/20 0800 (!) 152/106 -- -- 74 13 -- --  06/26/20 0746 (!) 153/106 -- -- 78 15 -- --  06/26/20 0745 (!) 154/100 98.8 F (37.1 C) Oral 79 15 100 % --  06/26/20 0444 (!) 139/96 99.3 F (37.4 C) Oral 82 18 100 % --  06/25/20 2100 139/87 98.9 F (37.2 C) Oral 85 18 100 % --  06/25/20 1730 121/88 99.6 F (37.6 C) Oral 83 18 98 % --    General: Awake and alert, no distress.   Eyes:  no scleral icterus.   ENT:  There were no oropharyngeal lesions.     Lymphatics:  Negative cervical, supraclavicular or axillary adenopathy.   Respiratory: lungs were clear bilaterally without wheezing or  crackles.   Cardiovascular:  Regular rate and rhythm, S1/S2, without murmur, rub or gallop.  There was no pedal edema.   GI:  abdomen was soft, flat, nontender, nondistended, without organomegaly.   Musculoskeletal: Strength symmetrical in the upper and lower extremities. Skin: No petechiae, dressing to right chest without signs of bleeding. Neuro exam was nonfocal.  Patient was alert and oriented.  Attention was good.   Language was appropriate.  Mood was normal without depression.  Speech was not pressured.  Thought content was not tangential.    LABS:  Lab Results  Component Value Date   WBC 4.3 06/26/2020   HGB 6.9 (LL) 06/26/2020   HCT 20.9 (L) 06/26/2020   PLT 52 (L) 06/26/2020   GLUCOSE 110 (H) 06/26/2020   ALT 13 06/24/2020   AST 12 (L) 06/24/2020   NA 141 06/26/2020   K 4.4 06/26/2020   CL 104 06/26/2020   CREATININE 13.98 (H) 06/26/2020   BUN 49 (H) 06/26/2020   CO2 25 06/26/2020    US Renal  Result Date: 06/21/2020 CLINICAL DATA:  Acute kidney injury EXAM: RENAL / URINARY TRACT ULTRASOUND COMPLETE COMPARISON:  None. FINDINGS: Right Kidney: Renal measurements: 8.7 x 3.6 x 4.7 cm = volume: 77 mL. There is diffusely increased echogenicity with atrophic appearance. Several anechoic cysts are seen within the right kidney the largest measuring 2.8 x 2.1 x 2.6 cm. Left Kidney: Renal measurements: 8.8 x 5.7 x 5.3 cm = volume: 140 mL. Diffusely increased echogenicity with a slightly atrophic appearance. Several anechoic cysts are seen within the left kidney measuring 3.2 x 2.0 x 2.8 cm Bladder: Appears normal for degree of bladder distention. Other: None. IMPRESSION: Diffusely increased parenchymal echogenicity with several anechoic cyst. This could be due to medical renal disease with possible polycystic kidneys. Electronically Signed   By: Prudencio Pair  M.D.   On: 06/21/2020 00:45   IR Fluoro Guide CV Line Right  Result Date: 06/23/2020 INDICATION: 29 year old male with end-stage  renal disease requiring dialysis. EXAM: TUNNELED PICC LINE WITH ULTRASOUND AND FLUOROSCOPIC GUIDANCE MEDICATIONS: Ancef IV. The antibiotic was given in an appropriate time interval prior to skin puncture. ANESTHESIA/SEDATION: Versed 2 mg IV; Fentanyl 75 mcg IV; Moderate Sedation Time:  23 minutes The patient was continuously monitored during the procedure by the interventional radiology nurse under my direct supervision. FLUOROSCOPY TIME:  Fluoroscopy Time: 0 minutes 42 seconds ( mGy). COMPLICATIONS: None immediate. PROCEDURE: Informed written consent was obtained from the patient after a discussion of the risks, benefits, and alternatives to treatment. Questions regarding the procedure were encouraged and answered. The right neck and chest were prepped with chlorhexidine in a sterile fashion, and a sterile drape was applied covering the operative field. Maximum barrier sterile technique with sterile gowns and gloves were used for the procedure. A timeout was performed prior to the initiation of the procedure. After creating a small venotomy incision, a micropuncture kit was utilized to access the right internal jugular vein under direct, real-time ultrasound guidance after the overlying soft tissues were anesthetized with 1% lidocaine with epinephrine. Ultrasound image documentation was performed. The microwire was kinked to measure appropriate catheter length. The micropuncture sheath was exchanged for a peel-away sheath over a guidewire. A 14 French dual lumen tunneled dialysis catheter measuring 23 cm tip to cuff cm was tunneled in a retrograde fashion from the anterior chest wall to the venotomy incision. The catheter was then placed through the peel-away sheath with tip ultimately positioned at the superior caval-atrial junction. Final catheter positioning was confirmed and documented with a spot radiographic image. The catheter aspirates and flushes normally. The catheter was flushed with appropriate volume  heparin dwells. The catheter exit site was secured with a 0-Prolene retention suture. The venotomy incision was closed with Dermabond. Dressings were applied. The patient tolerated the procedure well without immediate post procedural complication. FINDINGS: After catheter placement, the tip lies within the superior cavoatrial junction. The catheter aspirates and flushes normally and is ready for immediate use. IMPRESSION: Successful placement of 23 cm tip to cuffcm dual lumen tunneled dialysis catheter catheter via the right internal jugular vein with tip terminating at the superior caval atrial junction. The catheter is ready for immediate use. Electronically Signed   By: Constance Holster M.D.   On: 06/23/2020 18:48   IR US Guide Vasc Access Right  Result Date: 06/23/2020 INDICATION: 29 year old male with end-stage renal disease requiring dialysis. EXAM: TUNNELED PICC LINE WITH ULTRASOUND AND FLUOROSCOPIC GUIDANCE MEDICATIONS: Ancef IV. The antibiotic was given in an appropriate time interval prior to skin puncture. ANESTHESIA/SEDATION: Versed 2 mg IV; Fentanyl 75 mcg IV; Moderate Sedation Time:  23 minutes The patient was continuously monitored during the procedure by the interventional radiology nurse under my direct supervision. FLUOROSCOPY TIME:  Fluoroscopy Time: 0 minutes 42 seconds ( mGy). COMPLICATIONS: None immediate. PROCEDURE: Informed written consent was obtained from the patient after a discussion of the risks, benefits, and alternatives to treatment. Questions regarding the procedure were encouraged and answered. The right neck and chest were prepped with chlorhexidine in a sterile fashion, and a sterile drape was applied covering the operative field. Maximum barrier sterile technique with sterile gowns and gloves were used for the procedure. A timeout was performed prior to the initiation of the procedure. After creating a small venotomy incision, a micropuncture kit was utilized to access  the  right internal jugular vein under direct, real-time ultrasound guidance after the overlying soft tissues were anesthetized with 1% lidocaine with epinephrine. Ultrasound image documentation was performed. The microwire was kinked to measure appropriate catheter length. The micropuncture sheath was exchanged for a peel-away sheath over a guidewire. A 14 French dual lumen tunneled dialysis catheter measuring 23 cm tip to cuff cm was tunneled in a retrograde fashion from the anterior chest wall to the venotomy incision. The catheter was then placed through the peel-away sheath with tip ultimately positioned at the superior caval-atrial junction. Final catheter positioning was confirmed and documented with a spot radiographic image. The catheter aspirates and flushes normally. The catheter was flushed with appropriate volume heparin dwells. The catheter exit site was secured with a 0-Prolene retention suture. The venotomy incision was closed with Dermabond. Dressings were applied. The patient tolerated the procedure well without immediate post procedural complication. FINDINGS: After catheter placement, the tip lies within the superior cavoatrial junction. The catheter aspirates and flushes normally and is ready for immediate use. IMPRESSION: Successful placement of 23 cm tip to cuffcm dual lumen tunneled dialysis catheter catheter via the right internal jugular vein with tip terminating at the superior caval atrial junction. The catheter is ready for immediate use. Electronically Signed   By: Constance Holster M.D.   On: 06/23/2020 18:48   VAS [place] UPPER EXT VEIN MAPPING (PRE-OP AVF)  Result Date: 06/25/2020 UPPER EXTREMITY VEIN MAPPING  Indications: Pre Dialysis access. Limitations: superficial of veins, tissue properties Comparison Study: No prior study on file Performing Technologist: Sharion Dove RVS  Examination Guidelines: A complete evaluation includes B-mode imaging, spectral Doppler, color Doppler, and  power Doppler as needed of all accessible portions of each vessel. Bilateral testing is considered an integral part of a complete examination. Limited examinations for reoccurring indications may be performed as noted. +-----------------+-------------+----------+---------+ Right Cephalic   Diameter (cm)Depth (cm)Findings  +-----------------+-------------+----------+---------+ Prox upper arm       0.31        0.16             +-----------------+-------------+----------+---------+ Mid upper arm        0.33        0.19             +-----------------+-------------+----------+---------+ Dist upper arm       0.31        0.16   branching +-----------------+-------------+----------+---------+ Antecubital fossa    0.32        0.18             +-----------------+-------------+----------+---------+ Prox forearm         0.19        0.13   branching +-----------------+-------------+----------+---------+ Mid forearm          0.23        0.22             +-----------------+-------------+----------+---------+ Wrist                0.24        0.27             +-----------------+-------------+----------+---------+ +-----------------+-------------+----------+---------+ Left Cephalic    Diameter (cm)Depth (cm)Findings  +-----------------+-------------+----------+---------+ Prox upper arm       0.38        0.21             +-----------------+-------------+----------+---------+ Mid upper arm        0.48        0.17   branching +-----------------+-------------+----------+---------+  Dist upper arm       0.44        0.17             +-----------------+-------------+----------+---------+ Antecubital fossa    0.51        0.42             +-----------------+-------------+----------+---------+ Prox forearm         0.37        0.36             +-----------------+-------------+----------+---------+ Mid forearm          0.29        0.27   branching  +-----------------+-------------+----------+---------+ Dist forearm         0.19        0.30             +-----------------+-------------+----------+---------+ Wrist                0.29        0.20             +-----------------+-------------+----------+---------+ +-----------------+-------------+----------+--------------+ Left Basilic     Diameter (cm)Depth (cm)   Findings    +-----------------+-------------+----------+--------------+ Mid upper arm        0.30        0.34       origin     +-----------------+-------------+----------+--------------+ Dist upper arm       0.30        0.26     branching    +-----------------+-------------+----------+--------------+ Antecubital fossa    0.27        0.17                  +-----------------+-------------+----------+--------------+ Prox forearm         0.35        0.20                  +-----------------+-------------+----------+--------------+ Mid forearm          0.27        0.47     branching    +-----------------+-------------+----------+--------------+ Distal forearm       0.22        0.10                  +-----------------+-------------+----------+--------------+ Wrist                                   not visualized +-----------------+-------------+----------+--------------+ *See table(s) above for measurements and observations.  Diagnosing physician: Servando Snare MD Electronically signed by Servando Snare MD on 06/25/2020 at 11:31:50 AM.    Final      ASSESSMENT AND PLAN:   1) Anemia, thrombocytopenia, and intermittent leukopenia -06/20/2020-WBC 3.3, hemoglobin 5.6, hematocrit 17.5, platelet count 111,000 -06/21/2020-vitamin B12 277, folate 6.0, ferritin 222, iron 131, TIBC 176, percent saturation 74%, reticulocyte count percentage 1.1%, absolute reticulocyte count 24.0, immature reticulocyte fraction 4.0%.  Multiple myeloma panel pending.  UPEP ordered and awaiting collection. -WBC is now corrected but he  remains anemic and has worsening pancytopenia -Has been started on Aranesp per nephrology -We will add on additional work-up including LDH, haptoglobin, DAT, copper, methylmalonic acid, vitamin B12 level, reticulocytes, DIC panel, parvovirus, and peripheral blood smear. -Transfuse PRBCs for hemoglobin less than 7 or platelets for platelet count less than 10,000 or active bleeding.  2) ESRD -06/20/2020 -presented with a BUN of 130 and creatinine 24.48 -Nephrology is following and  he is currently on hemodialysis  3) HTN -On amlodipine and remains hypertensive -Further management per hospitalist/nephrology  Thank you for this referral.  Mikey Bussing, DNP, AGPCNP-BC, AOCNP Mon/Tues/Thurs/Fri 7am-5pm; Off Wednesdays Cell: [phone]   ADDENDUM  .Patient was Personally and independently interviewed, examined and relevant elements of the history of present illness were reviewed in details and an assessment and plan was created. All elements of the patient's history of present illness , assessment and plan were discussed in details with  Mikey Bussing, DNP, AGPCNP-BC, AOCNP. The above documentation reflects our combined findings assessment and plan.  29 yo with Pancytopenia in the context of severe renal failure and uncontrolled HTN Based on extensive discussion with patient and his parents at bedside the patient has been feeling unwell for >1 yr but had increased symptoms for fatigue, nausea and vominting for 1-2 months suggesting a more protracted process. Smear -- few schistocytes(not significant increased) and Burr cells, no blasts, decreased platelets, no significant clumping noted.  Overall the pancytopenia is likely related to uremic bone marrow suppression and potentially from uncontrolled HTN. 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 Poor nutritional status Mild B12 deficiency   -Leukopenia resolved with HD -Normocytic Anemia  -  likely from ESRD. Reticulocytes suppressed. No evidence of significant hemolysis. No clear evidence of TMA based on smear. Myeloma panel --pending --but less likely given patients age. ?nutritional deficiencies. -Thrombocytopenia - likely part of overall BM suppression. Immature platelet fraction pending. Additional thrombocytopenia post hospitalization could be related to heparin use. 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 -avoid meds that can cause thrombocytopenia. -BM Bx to r/o other etiologies of pancytopenia- ordered  Brian Duffy

## 2020-06-27 ENCOUNTER — Inpatient Hospital Stay (HOSPITAL_COMMUNITY): Payer: Medicaid Other | Admitting: Certified Registered Nurse Anesthetist

## 2020-06-27 ENCOUNTER — Encounter (HOSPITAL_COMMUNITY)
Admission: EM | Disposition: A | Discharge status: Home / Self Care | Payer: Self-pay | Source: Home / Self Care | Attending: Family Medicine

## 2020-06-27 DIAGNOSIS — D61818 Other pancytopenia: Secondary | ICD-10-CM

## 2020-06-27 HISTORY — PX: AV FISTULA PLACEMENT: SHX1204

## 2020-06-27 LAB — MULTIPLE MYELOMA PANEL, SERUM
Albumin SerPl Elph-Mcnc: 3.3 g/dL (ref 2.9–4.4)
Albumin/Glob SerPl: 1.6 (ref 0.7–1.7)
Alpha 1: 0.2 g/dL (ref 0.0–0.4)
Alpha2 Glob SerPl Elph-Mcnc: 0.4 g/dL (ref 0.4–1.0)
B-Globulin SerPl Elph-Mcnc: 0.7 g/dL (ref 0.7–1.3)
Gamma Glob SerPl Elph-Mcnc: 0.8 g/dL (ref 0.4–1.8)
Globulin, Total: 2.1 g/dL — ABNORMAL LOW (ref 2.2–3.9)
IgA: 181 mg/dL (ref 90–386)
IgG (Immunoglobin G), Serum: 876 mg/dL (ref 603–1613)
IgM (Immunoglobulin M), Srm: 26 mg/dL (ref 20–172)
Total Protein ELP: 5.4 g/dL — ABNORMAL LOW (ref 6.0–8.5)

## 2020-06-27 LAB — CBC
HCT: 23.1 % — ABNORMAL LOW (ref 39.0–52.0)
Hemoglobin: 7.5 g/dL — ABNORMAL LOW (ref 13.0–17.0)
MCH: 29.8 pg (ref 26.0–34.0)
MCHC: 32.5 g/dL (ref 30.0–36.0)
MCV: 91.7 fL (ref 80.0–100.0)
Platelets: 46 10*3/uL — ABNORMAL LOW (ref 150–400)
RBC: 2.52 MIL/uL — ABNORMAL LOW (ref 4.22–5.81)
RDW: 14.7 % (ref 11.5–15.5)
WBC: 4.6 10*3/uL (ref 4.0–10.5)
nRBC: 0 % (ref 0.0–0.2)

## 2020-06-27 LAB — IMMATURE PLATELET FRACTION: Immature Platelet Fraction: 6.8 % (ref 1.2–8.6)

## 2020-06-27 LAB — BASIC METABOLIC PANEL
Anion gap: 12 (ref 5–15)
BUN: 26 mg/dL — ABNORMAL HIGH (ref 6–20)
CO2: 27 mmol/L (ref 22–32)
Calcium: 8.4 mg/dL — ABNORMAL LOW (ref 8.9–10.3)
Chloride: 100 mmol/L (ref 98–111)
Creatinine, Ser: 9.72 mg/dL — ABNORMAL HIGH (ref 0.61–1.24)
GFR, Estimated: 7 mL/min — ABNORMAL LOW (ref >60)
Glucose, Bld: 82 mg/dL (ref 70–99)
Potassium: 4.4 mmol/L (ref 3.5–5.1)
Sodium: 139 mmol/L (ref 135–145)

## 2020-06-27 LAB — PARVOVIRUS B19 ANTIBODY, IGG AND IGM
Parovirus B19 IgG Abs: 6.4 index — ABNORMAL HIGH (ref 0.0–0.8)
Parovirus B19 IgM Abs: 0.1 index (ref 0.0–0.8)

## 2020-06-27 LAB — SURGICAL PCR SCREEN
MRSA, PCR: NEGATIVE
Staphylococcus aureus: NEGATIVE

## 2020-06-27 LAB — HAPTOGLOBIN: Haptoglobin: 74 mg/dL (ref 17–317)

## 2020-06-27 LAB — PATHOLOGIST SMEAR REVIEW: Path Review: 10112021

## 2020-06-27 SURGERY — ARTERIOVENOUS (AV) FISTULA CREATION
Anesthesia: Monitor Anesthesia Care | Site: Arm Lower | Laterality: Left

## 2020-06-27 MED ORDER — B COMPLEX-C PO TABS
1.0000 | ORAL_TABLET | Freq: Every day | ORAL | Status: DC
  Administered 2020-06-27 – 2020-06-29 (×3): 1 via ORAL
  Filled 2020-06-27 (×3): qty 1

## 2020-06-27 MED ORDER — MIDAZOLAM HCL 2 MG/2ML IJ SOLN
INTRAMUSCULAR | Status: AC
  Filled 2020-06-27: qty 2

## 2020-06-27 MED ORDER — HYDRALAZINE HCL 20 MG/ML IJ SOLN
INTRAMUSCULAR | Status: AC
  Administered 2020-06-27: 10 mg via INTRAVENOUS
  Filled 2020-06-27: qty 1

## 2020-06-27 MED ORDER — AMLODIPINE BESYLATE 10 MG PO TABS
10.0000 mg | ORAL_TABLET | Freq: Every day | ORAL | Status: DC
  Administered 2020-06-28 – 2020-06-29 (×2): 10 mg via ORAL
  Filled 2020-06-27 (×2): qty 1

## 2020-06-27 MED ORDER — ROCURONIUM BROMIDE 10 MG/ML (PF) SYRINGE
PREFILLED_SYRINGE | INTRAVENOUS | Status: AC
  Filled 2020-06-27: qty 10

## 2020-06-27 MED ORDER — SODIUM CHLORIDE 0.9 % IV SOLN: INTRAVENOUS | Status: DC | PRN

## 2020-06-27 MED ORDER — 0.9 % SODIUM CHLORIDE (POUR BTL) OPTIME
TOPICAL | Status: DC | PRN
  Administered 2020-06-27: 1000 mL

## 2020-06-27 MED ORDER — FENTANYL CITRATE (PF) 100 MCG/2ML IJ SOLN
INTRAMUSCULAR | Status: DC | PRN
  Administered 2020-06-27 (×2): 25 ug via INTRAVENOUS

## 2020-06-27 MED ORDER — PROMETHAZINE HCL 25 MG/ML IJ SOLN: 6.2500 mg | INTRAMUSCULAR | Status: DC | PRN

## 2020-06-27 MED ORDER — VITAMIN B-12 1000 MCG PO TABS
1000.0000 ug | ORAL_TABLET | Freq: Every day | ORAL | Status: DC
  Administered 2020-06-27 – 2020-06-29 (×3): 1000 ug via ORAL
  Filled 2020-06-27 (×3): qty 1

## 2020-06-27 MED ORDER — LIDOCAINE-EPINEPHRINE 0.5 %-1:200000 IJ SOLN
INTRAMUSCULAR | Status: DC | PRN
  Administered 2020-06-27: 6 mL

## 2020-06-27 MED ORDER — ONDANSETRON HCL 4 MG/2ML IJ SOLN
INTRAMUSCULAR | Status: DC | PRN
  Administered 2020-06-27: 4 mg via INTRAVENOUS

## 2020-06-27 MED ORDER — MIDAZOLAM HCL 5 MG/5ML IJ SOLN
INTRAMUSCULAR | Status: DC | PRN
  Administered 2020-06-27 (×2): 1 mg via INTRAVENOUS

## 2020-06-27 MED ORDER — DEXAMETHASONE SODIUM PHOSPHATE 10 MG/ML IJ SOLN
INTRAMUSCULAR | Status: AC
  Filled 2020-06-27: qty 1

## 2020-06-27 MED ORDER — PROPOFOL 500 MG/50ML IV EMUL
INTRAVENOUS | Status: DC | PRN
  Administered 2020-06-27: 125 ug/kg/min via INTRAVENOUS
  Administered 2020-06-27: 50 ug/kg/min via INTRAVENOUS

## 2020-06-27 MED ORDER — ONDANSETRON HCL 4 MG/2ML IJ SOLN
INTRAMUSCULAR | Status: AC
  Filled 2020-06-27: qty 2

## 2020-06-27 MED ORDER — OXYCODONE HCL 5 MG/5ML PO SOLN: 5.0000 mg | Freq: Once | ORAL | Status: DC | PRN

## 2020-06-27 MED ORDER — FENTANYL CITRATE (PF) 100 MCG/2ML IJ SOLN: 25.0000 ug | INTRAMUSCULAR | Status: DC | PRN

## 2020-06-27 MED ORDER — LIDOCAINE 20MG/ML (2%) 15 ML SYRINGE OPTIME
INTRAMUSCULAR | Status: DC | PRN
  Administered 2020-06-27: 40 mg via INTRAVENOUS

## 2020-06-27 MED ORDER — FENTANYL CITRATE (PF) 250 MCG/5ML IJ SOLN
INTRAMUSCULAR | Status: AC
  Filled 2020-06-27: qty 5

## 2020-06-27 MED ORDER — LIDOCAINE 2% (20 MG/ML) 5 ML SYRINGE
INTRAMUSCULAR | Status: AC
  Filled 2020-06-27: qty 5

## 2020-06-27 MED ORDER — HYDRALAZINE HCL 20 MG/ML IJ SOLN: 10.0000 mg | Freq: Once | INTRAMUSCULAR | Status: AC

## 2020-06-27 MED ORDER — PROPOFOL 10 MG/ML IV BOLUS
INTRAVENOUS | Status: AC
  Filled 2020-06-27: qty 20

## 2020-06-27 MED ORDER — DIPHENHYDRAMINE HCL 50 MG/ML IJ SOLN
INTRAMUSCULAR | Status: DC | PRN
  Administered 2020-06-27: 12.5 mg via INTRAVENOUS

## 2020-06-27 MED ORDER — LIDOCAINE-EPINEPHRINE 0.5 %-1:200000 IJ SOLN
INTRAMUSCULAR | Status: AC
  Filled 2020-06-27: qty 1

## 2020-06-27 MED ORDER — SODIUM CHLORIDE 0.9 % IV SOLN
INTRAVENOUS | Status: AC
  Filled 2020-06-27: qty 1.2

## 2020-06-27 MED ORDER — OXYCODONE HCL 5 MG PO TABS: 5.0000 mg | ORAL_TABLET | Freq: Once | ORAL | Status: DC | PRN

## 2020-06-27 SURGICAL SUPPLY — 30 items
ARMBAND PINK RESTRICT EXTREMIT (MISCELLANEOUS) ×4 IMPLANT
CANISTER SUCT 3000ML PPV (MISCELLANEOUS) ×2 IMPLANT
CANNULA VESSEL 3MM 2 BLNT TIP (CANNULA) ×2 IMPLANT
CLIP LIGATING EXTRA MED SLVR (CLIP) ×2 IMPLANT
CLIP LIGATING EXTRA SM BLUE (MISCELLANEOUS) ×2 IMPLANT
COVER PROBE W GEL 5X96 (DRAPES) ×2 IMPLANT
COVER WAND RF STERILE (DRAPES) ×2 IMPLANT
DECANTER SPIKE VIAL GLASS SM (MISCELLANEOUS) ×2 IMPLANT
DERMABOND ADHESIVE PROPEN (GAUZE/BANDAGES/DRESSINGS) ×1
DERMABOND ADVANCED (GAUZE/BANDAGES/DRESSINGS) ×1
DERMABOND ADVANCED .7 DNX12 (GAUZE/BANDAGES/DRESSINGS) ×1 IMPLANT
DERMABOND ADVANCED .7 DNX6 (GAUZE/BANDAGES/DRESSINGS) IMPLANT
ELECT REM PT RETURN 9FT ADLT (ELECTROSURGICAL) ×2
ELECTRODE REM PT RTRN 9FT ADLT (ELECTROSURGICAL) ×1 IMPLANT
GLOVE SS BIOGEL STRL SZ 7.5 (GLOVE) ×1 IMPLANT
GLOVE SUPERSENSE BIOGEL SZ 7.5 (GLOVE) ×1
GOWN STRL REUS W/ TWL LRG LVL3 (GOWN DISPOSABLE) ×3 IMPLANT
GOWN STRL REUS W/TWL LRG LVL3 (GOWN DISPOSABLE) ×3
KIT BASIN OR (CUSTOM PROCEDURE TRAY) ×2 IMPLANT
KIT TURNOVER KIT B (KITS) ×2 IMPLANT
NS IRRIG 1000ML POUR BTL (IV SOLUTION) ×2 IMPLANT
PACK CV ACCESS (CUSTOM PROCEDURE TRAY) ×2 IMPLANT
PAD ARMBOARD 7.5X6 YLW CONV (MISCELLANEOUS) ×4 IMPLANT
SUT PROLENE 6 0 CC (SUTURE) ×2 IMPLANT
SUT VIC AB 3-0 SH 27 (SUTURE) ×1
SUT VIC AB 3-0 SH 27X BRD (SUTURE) ×1 IMPLANT
SYR BULB IRRIG 60ML STRL (SYRINGE) ×1 IMPLANT
TOWEL GREEN STERILE (TOWEL DISPOSABLE) ×2 IMPLANT
UNDERPAD 30X36 HEAVY ABSORB (UNDERPADS AND DIAPERS) ×2 IMPLANT
WATER STERILE IRR 1000ML POUR (IV SOLUTION) ×2 IMPLANT

## 2020-06-27 NOTE — Plan of Care (Signed)
  Problem: Health Behavior/Discharge Planning: Goal: Ability to manage health-related needs will improve Outcome: Completed/Met   Problem: Clinical Measurements: Goal: Complications related to the disease process or treatment will be avoided or minimized Outcome: Completed/Met   Problem: Fluid Volume: Goal: Fluid volume balance will be maintained or improved Outcome: Completed/Met   Problem: Nutritional: Goal: Ability to make appropriate dietary choices will improve Outcome: Completed/Met

## 2020-06-27 NOTE — Progress Notes (Signed)
[facility] Progress Note   Subjective:    HD#3 yesterday, No UF Onc consulted for pancytopenia / TCP W/u in process, for BM Bx S/p L RC AVF this AM   Objective Vitals:   06/27/20 0920 06/27/20 0935 06/27/20 0945 06/27/20 0958  BP: (!) 168/89 (!) 164/98 (!) 172/91 (!) 167/78  Pulse: 78 64 70 66  Resp: 13 19 12 14   Temp: 97.8 F (36.6 C)  97.7 F (36.5 C) 97.7 F (36.5 C)  TempSrc:      SpO2: 99% 100% 100% 100%  Weight:      Height:       Physical Exam General: comfortable Heart: RRR Lungs:clear Extremities: no edema Neuro: nonfocal Psych:  Normal mood and affect in circumstances Dialysis access:  [facility] with dressing c/d/i  Additional Objective Labs: Basic Metabolic Panel: Recent Labs  Lab 06/21/20 1427 06/22/20 0052 06/25/20 0105 06/26/20 0804 06/27/20 0300  NA 142   < > 140 141 139  K 4.4   < > 3.8 4.4 4.4  CL 110   < > 103 104 100  CO2 15*   < > 28 25 27   GLUCOSE 80   < > 94 110* 82  BUN 123*   < > 40* 49* 26*  CREATININE 22.42*   < > 11.35* 13.98* 9.72*  CALCIUM 6.3*  6.2   < > 7.1* 7.9* 8.4*  PHOS 10.3*  --  4.4 6.3*  --    < > = values in this interval not displayed.   Liver Function Tests: Recent Labs  Lab 06/22/20 0052 06/22/20 0052 06/23/20 0249 06/23/20 0249 06/24/20 0603 06/25/20 0105 06/26/20 0804  AST 11*  --  13*  --  12*  --   --   ALT 15  --  17  --  13  --   --   ALKPHOS 28*  --  31*  --  28*  --   --   BILITOT 0.9  --  0.8  --  0.8  --   --   PROT 5.2*  --  5.8*  --  5.7*  --   --   ALBUMIN 3.3*   < > 3.5   < > 3.5 3.1* 3.3*   < > = values in this interval not displayed.   No results for input(s): LIPASE, AMYLASE in the last 168 hours. CBC: Recent Labs  Lab 06/21/20 1427 06/21/20 1427 06/22/20 0052 06/22/20 0052 06/24/20 0603 06/24/20 0603 06/24/20 2218 06/24/20 2218 06/25/20 0105 06/25/20 0105 06/26/20 0804 06/26/20 1837 06/27/20 0300  WBC 2.7*   < > 4.1   < > 4.2   < > 3.5*   < > 3.7*  --   4.3  --  4.6  NEUTROABS 1.4*  --  2.4  --  3.1  --   --   --   --   --   --   --   --   HGB 7.0*   < > 7.5*   < > 7.9*   < > 7.3*   < > 7.1*  --  6.9*  --  7.5*  HCT 20.9*   < > 22.0*   < > 23.9*   < > 22.2*   < > 21.5*  --  20.9*  --  23.1*  MCV 92.5   < > 92.4   < > 91.2  --  91.0  --  91.5  --  94.6  --  91.7  PLT 96*   < > 96*   < > 84*   < > 53*   < > 54*   < > 52* 44* 46*   < > = values in this interval not displayed.   Blood Culture No results found for: SDES, SPECREQUEST, CULT, REPTSTATUS  Cardiac Enzymes: No results for input(s): CKTOTAL, CKMB, CKMBINDEX, TROPONINI in the last 168 hours. CBG: No results for input(s): GLUCAP in the last 168 hours. Iron Studies:  No results for input(s): IRON, TIBC, TRANSFERRIN, FERRITIN in the last 72 hours. @lablastinr3 @ Studies/Results: No results found. Medications: . sodium chloride Stopped (06/21/20 0509)  . desmopressin (DDAVP) IV for Bleeding     . amLODipine  5 mg Oral Daily  . B-complex with vitamin C  1 tablet Oral Daily  . calcitRIOL  0.25 mcg Oral Daily  . calcium carbonate  400 mg of elemental calcium Oral TID WC  . Chlorhexidine Gluconate Cloth  6 each Topical Q0600  . [START ON 07/01/2020] darbepoetin (ARANESP) injection - DIALYSIS  60 mcg Intravenous Q Sat-HD  . feeding supplement (NEPRO CARB STEADY)  237 mL Oral TID BM  . loperamide  2 mg Oral Q8H  . vitamin B-12  1,000 mcg Oral Daily    Assessment/Plan: **ESRD:  I suspect this represents ESRD given labs and appearance of kidneys on [place] with diffuse echogenicity, small, atrophic appearance.  Serologies all negative. Oncology eval for cytopenias in process.  Has TDC and L RC AVF. Awaiting CLIP.    **HTN:  Remains elevated, inc amlodipine.  **Pancytopenia:  Anemia expected with CKD but not pancytopenia.  Iron ok, startede ESA. HIV neg.  Heme following  **Metabolic acidosis: follow  **N/V/diarrhea:  Suspect mostly uremia; Gi pathogen panel neg..  imnproved  **secondary hyperPTH: concomitant hypoCa.  PTH 479.  calcitriol 0.25 daily.  On ca carbonate TIDAC for binder --> would change to nonca based binder long term but for now in context of hypoCa cont.   Dispo: pending initiation of dialysis and arrangement of outpt HD.    Rexene Agent  06/27/2020, 1:58 PM  [employer]

## 2020-06-27 NOTE — Consult Note (Signed)
Chief Complaint: Thrombocytopenia. Request is for bone marrow biopsy.   Referring Physician(s): Dr. Carolyne Fiscal  Supervising Physician: Luanne Bras  Patient Status: [facility] - In-pt  History of Present Illness: Brian Duffy. is a 29 y.o. male History of ESRD. Presented to the ED at [facility] with worsening fatigue, nausea,, vomiting and fatigue. Found to be in AKI, uremic with metabolic acidosis and thrombocytopenia. IR placed a perm cath placement on 10.9.21 with a purse string suture applied do to persistent oozing around the exit site.  Team is requesting a bone marrow biopsy for further determination of thrombocytopenia  History reviewed. No pertinent past medical history.  Past Surgical History:  Procedure Laterality Date  . IR FLUORO GUIDE CV LINE RIGHT  06/23/2020  . IR US GUIDE VASC ACCESS RIGHT  06/23/2020    Allergies: Patient has no known allergies.  Medications: Prior to Admission medications   Not on File     History reviewed. No pertinent family history.  Social History   Socioeconomic History  . Marital status: Single    Spouse name: Not on file  . Number of children: Not on file  . Years of education: Not on file  . Highest education level: Not on file  Occupational History  . Not on file  Tobacco Use  . Smoking status: Current Every Day Smoker    Types: Cigarettes  . Smokeless tobacco: Never Used  . Tobacco comment: 3 to 4 cigarettes daily  Substance and Sexual Activity  . Alcohol use: Not Currently  . Drug use: Yes    Types: Marijuana  . Sexual activity: Not on file  Other Topics Concern  . Not on file  Social History Narrative  . Not on file   Social Determinants of Health   Financial Resource Strain:   . Difficulty of Paying Living Expenses: Not on file  Food Insecurity:   . Worried About Charity fundraiser in the Last Year: Not on file  . Ran Out of Food in the Last Year: Not on file  Transportation Needs:   . Lack of  Transportation (Medical): Not on file  . Lack of Transportation (Non-Medical): Not on file  Physical Activity:   . Days of Exercise per Week: Not on file  . Minutes of Exercise per Session: Not on file  Stress:   . Feeling of Stress : Not on file  Social Connections:   . Frequency of Communication with Friends and Family: Not on file  . Frequency of Social Gatherings with Friends and Family: Not on file  . Attends Religious Services: Not on file  . Active Member of Clubs or Organizations: Not on file  . Attends Archivist Meetings: Not on file  . Marital Status: Not on file     Review of Systems: A 12 point ROS discussed and pertinent positives are indicated in the HPI above.  All other systems are negative.  Review of Systems  Constitutional: Negative for fever.  HENT: Negative for congestion.   Respiratory: Negative for cough and shortness of breath.   Cardiovascular: Negative for chest pain.       Pain at site of newly placed AVF  Gastrointestinal: Negative for abdominal pain.  Neurological: Negative for headaches.  Psychiatric/Behavioral: Negative for behavioral problems and confusion.    Vital Signs: BP (!) 169/114   Pulse 82   Temp 98.9 F (37.2 C) (Oral)   Resp 20   Ht $R'6\' 2"'Wu$  (1.88 m)  Wt 159 lb 2.8 oz (72.2 kg)   SpO2 99%   BMI 20.44 kg/m   Physical Exam Vitals and nursing note reviewed.  Constitutional:      Appearance: He is well-developed.  HENT:     Head: Normocephalic.  Cardiovascular:     Rate and Rhythm: Normal rate and regular rhythm.     Heart sounds: Normal heart sounds.  Pulmonary:     Effort: Pulmonary effort is normal.     Breath sounds: Normal breath sounds.  Musculoskeletal:        General: Normal range of motion.     Cervical back: Normal range of motion.  Skin:    General: Skin is dry.  Neurological:     Mental Status: He is alert and oriented to person, place, and time.     Imaging: US Renal  Result Date:  06/21/2020 CLINICAL DATA:  Acute kidney injury EXAM: RENAL / URINARY TRACT ULTRASOUND COMPLETE COMPARISON:  None. FINDINGS: Right Kidney: Renal measurements: 8.7 x 3.6 x 4.7 cm = volume: 77 mL. There is diffusely increased echogenicity with atrophic appearance. Several anechoic cysts are seen within the right kidney the largest measuring 2.8 x 2.1 x 2.6 cm. Left Kidney: Renal measurements: 8.8 x 5.7 x 5.3 cm = volume: 140 mL. Diffusely increased echogenicity with a slightly atrophic appearance. Several anechoic cysts are seen within the left kidney measuring 3.2 x 2.0 x 2.8 cm Bladder: Appears normal for degree of bladder distention. Other: None. IMPRESSION: Diffusely increased parenchymal echogenicity with several anechoic cyst. This could be due to medical renal disease with possible polycystic kidneys. Electronically Signed   By: Prudencio Pair M.D.   On: 06/21/2020 00:45   IR Fluoro Guide CV Line Right  Result Date: 06/23/2020 INDICATION: 29 year old male with end-stage renal disease requiring dialysis. EXAM: TUNNELED PICC LINE WITH ULTRASOUND AND FLUOROSCOPIC GUIDANCE MEDICATIONS: Ancef IV. The antibiotic was given in an appropriate time interval prior to skin puncture. ANESTHESIA/SEDATION: Versed 2 mg IV; Fentanyl 75 mcg IV; Moderate Sedation Time:  23 minutes The patient was continuously monitored during the procedure by the interventional radiology nurse under my direct supervision. FLUOROSCOPY TIME:  Fluoroscopy Time: 0 minutes 42 seconds ( mGy). COMPLICATIONS: None immediate. PROCEDURE: Informed written consent was obtained from the patient after a discussion of the risks, benefits, and alternatives to treatment. Questions regarding the procedure were encouraged and answered. The right neck and chest were prepped with chlorhexidine in a sterile fashion, and a sterile drape was applied covering the operative field. Maximum barrier sterile technique with sterile gowns and gloves were used for the  procedure. A timeout was performed prior to the initiation of the procedure. After creating a small venotomy incision, a micropuncture kit was utilized to access the right internal jugular vein under direct, real-time ultrasound guidance after the overlying soft tissues were anesthetized with 1% lidocaine with epinephrine. Ultrasound image documentation was performed. The microwire was kinked to measure appropriate catheter length. The micropuncture sheath was exchanged for a peel-away sheath over a guidewire. A 14 French dual lumen tunneled dialysis catheter measuring 23 cm tip to cuff cm was tunneled in a retrograde fashion from the anterior chest wall to the venotomy incision. The catheter was then placed through the peel-away sheath with tip ultimately positioned at the superior caval-atrial junction. Final catheter positioning was confirmed and documented with a spot radiographic image. The catheter aspirates and flushes normally. The catheter was flushed with appropriate volume heparin dwells. The catheter exit site was  secured with a 0-Prolene retention suture. The venotomy incision was closed with Dermabond. Dressings were applied. The patient tolerated the procedure well without immediate post procedural complication. FINDINGS: After catheter placement, the tip lies within the superior cavoatrial junction. The catheter aspirates and flushes normally and is ready for immediate use. IMPRESSION: Successful placement of 23 cm tip to cuffcm dual lumen tunneled dialysis catheter catheter via the right internal jugular vein with tip terminating at the superior caval atrial junction. The catheter is ready for immediate use. Electronically Signed   By: Constance Holster M.D.   On: 06/23/2020 18:48   IR US Guide Vasc Access Right  Result Date: 06/23/2020 INDICATION: 29 year old male with end-stage renal disease requiring dialysis. EXAM: TUNNELED PICC LINE WITH ULTRASOUND AND FLUOROSCOPIC GUIDANCE MEDICATIONS:  Ancef IV. The antibiotic was given in an appropriate time interval prior to skin puncture. ANESTHESIA/SEDATION: Versed 2 mg IV; Fentanyl 75 mcg IV; Moderate Sedation Time:  23 minutes The patient was continuously monitored during the procedure by the interventional radiology nurse under my direct supervision. FLUOROSCOPY TIME:  Fluoroscopy Time: 0 minutes 42 seconds ( mGy). COMPLICATIONS: None immediate. PROCEDURE: Informed written consent was obtained from the patient after a discussion of the risks, benefits, and alternatives to treatment. Questions regarding the procedure were encouraged and answered. The right neck and chest were prepped with chlorhexidine in a sterile fashion, and a sterile drape was applied covering the operative field. Maximum barrier sterile technique with sterile gowns and gloves were used for the procedure. A timeout was performed prior to the initiation of the procedure. After creating a small venotomy incision, a micropuncture kit was utilized to access the right internal jugular vein under direct, real-time ultrasound guidance after the overlying soft tissues were anesthetized with 1% lidocaine with epinephrine. Ultrasound image documentation was performed. The microwire was kinked to measure appropriate catheter length. The micropuncture sheath was exchanged for a peel-away sheath over a guidewire. A 14 French dual lumen tunneled dialysis catheter measuring 23 cm tip to cuff cm was tunneled in a retrograde fashion from the anterior chest wall to the venotomy incision. The catheter was then placed through the peel-away sheath with tip ultimately positioned at the superior caval-atrial junction. Final catheter positioning was confirmed and documented with a spot radiographic image. The catheter aspirates and flushes normally. The catheter was flushed with appropriate volume heparin dwells. The catheter exit site was secured with a 0-Prolene retention suture. The venotomy incision was  closed with Dermabond. Dressings were applied. The patient tolerated the procedure well without immediate post procedural complication. FINDINGS: After catheter placement, the tip lies within the superior cavoatrial junction. The catheter aspirates and flushes normally and is ready for immediate use. IMPRESSION: Successful placement of 23 cm tip to cuffcm dual lumen tunneled dialysis catheter catheter via the right internal jugular vein with tip terminating at the superior caval atrial junction. The catheter is ready for immediate use. Electronically Signed   By: Constance Holster M.D.   On: 06/23/2020 18:48   VAS [place] UPPER EXT VEIN MAPPING (PRE-OP AVF)  Result Date: 06/25/2020 UPPER EXTREMITY VEIN MAPPING  Indications: Pre Dialysis access. Limitations: superficial of veins, tissue properties Comparison Study: No prior study on file Performing Technologist: Sharion Dove RVS  Examination Guidelines: A complete evaluation includes B-mode imaging, spectral Doppler, color Doppler, and power Doppler as needed of all accessible portions of each vessel. Bilateral testing is considered an integral part of a complete examination. Limited examinations for reoccurring indications may be performed  as noted. +-----------------+-------------+----------+---------+ Right Cephalic   Diameter (cm)Depth (cm)Findings  +-----------------+-------------+----------+---------+ Prox upper arm       0.31        0.16             +-----------------+-------------+----------+---------+ Mid upper arm        0.33        0.19             +-----------------+-------------+----------+---------+ Dist upper arm       0.31        0.16   branching +-----------------+-------------+----------+---------+ Antecubital fossa    0.32        0.18             +-----------------+-------------+----------+---------+ Prox forearm         0.19        0.13   branching +-----------------+-------------+----------+---------+ Mid forearm           0.23        0.22             +-----------------+-------------+----------+---------+ Wrist                0.24        0.27             +-----------------+-------------+----------+---------+ +-----------------+-------------+----------+---------+ Left Cephalic    Diameter (cm)Depth (cm)Findings  +-----------------+-------------+----------+---------+ Prox upper arm       0.38        0.21             +-----------------+-------------+----------+---------+ Mid upper arm        0.48        0.17   branching +-----------------+-------------+----------+---------+ Dist upper arm       0.44        0.17             +-----------------+-------------+----------+---------+ Antecubital fossa    0.51        0.42             +-----------------+-------------+----------+---------+ Prox forearm         0.37        0.36             +-----------------+-------------+----------+---------+ Mid forearm          0.29        0.27   branching +-----------------+-------------+----------+---------+ Dist forearm         0.19        0.30             +-----------------+-------------+----------+---------+ Wrist                0.29        0.20             +-----------------+-------------+----------+---------+ +-----------------+-------------+----------+--------------+ Left Basilic     Diameter (cm)Depth (cm)   Findings    +-----------------+-------------+----------+--------------+ Mid upper arm        0.30        0.34       origin     +-----------------+-------------+----------+--------------+ Dist upper arm       0.30        0.26     branching    +-----------------+-------------+----------+--------------+ Antecubital fossa    0.27        0.17                  +-----------------+-------------+----------+--------------+ Prox forearm         0.35        0.20                  +-----------------+-------------+----------+--------------+  Mid forearm          0.27         0.47     branching    +-----------------+-------------+----------+--------------+ Distal forearm       0.22        0.10                  +-----------------+-------------+----------+--------------+ Wrist                                   not visualized +-----------------+-------------+----------+--------------+ *See table(s) above for measurements and observations.  Diagnosing physician: Servando Snare MD Electronically signed by Servando Snare MD on 06/25/2020 at 11:31:50 AM.    Final     Labs:  CBC: Recent Labs    06/24/20 2218 06/24/20 2218 06/25/20 0105 06/26/20 0804 06/26/20 1837 06/27/20 0300  WBC 3.5*  --  3.7* 4.3  --  4.6  HGB 7.3*  --  7.1* 6.9*  --  7.5*  HCT 22.2*  --  21.5* 20.9*  --  23.1*  PLT 53*   < > 54* 52* 44* 46*   < > = values in this interval not displayed.    COAGS: Recent Labs    06/26/20 1837  INR 1.1  APTT 26    BMP: Recent Labs    06/24/20 0603 06/25/20 0105 06/26/20 0804 06/27/20 0300  NA 140 140 141 139  K 3.4* 3.8 4.4 4.4  CL 104 103 104 100  CO2 $Re'22 28 25 27  'ucS$ GLUCOSE 96 94 110* 82  BUN 73* 40* 49* 26*  CALCIUM 6.9* 7.1* 7.9* 8.4*  CREATININE 15.78* 11.35* 13.98* 9.72*  GFRNONAA 4* 5* 4* 7*    LIVER FUNCTION TESTS: Recent Labs    06/22/20 0052 06/22/20 0052 06/23/20 0249 06/24/20 0603 06/25/20 0105 06/26/20 0804  BILITOT 0.9  --  0.8 0.8  --   --   AST 11*  --  13* 12*  --   --   ALT 15  --  17 13  --   --   ALKPHOS 28*  --  31* 28*  --   --   PROT 5.2*  --  5.8* 5.7*  --   --   ALBUMIN 3.3*   < > 3.5 3.5 3.1* 3.3*   < > = values in this interval not displayed.    Assessment and Plan:  29 y.o. male inpatient. History of ESRD. Presented to the ED at [facility] with worsening fatigue, nausea,, vomiting and fatigue. Found to be in AKI, uremic with metabolic acidosis and thrombocytopenia. IR placed a perm cath placement on 10.9.21 with a purse string suture applied do to persistent oozing around the exit site.  Team  is requesting a bone marrow biopsy for further determination of thrombocytopenia.  WBC is 4.6, Hgb 7.5, PLT 46, BUN 26, Cr 9.72. All other of labs and medications are within acceptable parameters. NKDA.   IR consulted for possible bone marrow biopsy. Case has been reviewed and procedure approved by Dr. Serafina Royals.  Patient tentatively scheduled for 10.13.21.  Team instructed to: Keep Patient to be NPO after midnight  IR will call patient when ready.  Thank you for this interesting consult.  I greatly enjoyed meeting Brian Duffy. and look forward to participating in their care.  A copy of this report was sent to the requesting provider on this date.  Electronically Signed: Gerline Legacy  Helen Cuff, NP 06/27/2020, 8:28 AM   I spent a total of 40 Minutes   in face to face in clinical consultation, greater than 50% of which was counseling/coordinating care for Bone marrow biopsy

## 2020-06-27 NOTE — Transfer of Care (Signed)
Immediate Anesthesia Transfer of Care Note  Patient: Kiefer Opheim.  Procedure(s) Performed: LEFT ARM ARTERIOVENOUS (AV) FISTULA VERSUS ARTERIOVENOUS GRAFT CREATION (Left Arm Lower)  Patient Location: PACU  Anesthesia Type:MAC  Level of Consciousness: awake and patient cooperative  Airway & Oxygen Therapy: Patient Spontanous Breathing  Post-op Assessment: Report given to RN and Post -op Vital signs reviewed and stable  Post vital signs: Reviewed and stable  Last Vitals:  Vitals Value Taken Time  BP    Temp    Pulse 75 06/27/20 0921  Resp 12 06/27/20 0921  SpO2 100 % 06/27/20 0921  Vitals shown include unvalidated device data.  Last Pain:  Vitals:   06/27/20 0600  TempSrc:   PainSc: 0-No pain      Patients Stated Pain Goal: 0 (16/55/37 4827)  Complications: No complications documented.

## 2020-06-27 NOTE — Progress Notes (Signed)
Patient has been financially cleared and accepted at [facility] outpatient HD clinic on a MWF schedule with a seat time of 11:00am. He needs to arrive to his appointments at 10:40am. On his first day at the clinic, he needs to arrive at 10:00am to complete intake paperwork prior to his first treatment. Navigator informed Attending and Nephrologist. Navigator met with patient to discuss seat schedule verbally and give to patient in writing. He states understanding and no questions.  Brian Duffy, [place] Renal Navigator [phone]

## 2020-06-27 NOTE — Interval H&P Note (Signed)
History and Physical Interval Note:  06/27/2020 7:21 AM  Brian Duffy.  has presented today for surgery, with the diagnosis of END STAGE RENAL DISEASE.  The various methods of treatment have been discussed with the patient and family. After consideration of risks, benefits and other options for treatment, the patient has consented to  Procedure(s): LEFT ARM ARTERIOVENOUS (AV) FISTULA VERSUS ARTERIOVENOUS GRAFT CREATION (Left) as a surgical intervention.  The patient's history has been reviewed, patient examined, no change in status, stable for surgery.  I have reviewed the patient's chart and labs.  Questions were answered to the patient's satisfaction.     Curt Jews

## 2020-06-27 NOTE — Progress Notes (Signed)
Renal Navigator requests update on financial clearance from [facility] Admissions-still waiting to be cleared to start in outpatient HD clinic. Navigator will continue to follow closely.  Alphonzo Cruise, [place] Renal Navigator [phone]

## 2020-06-27 NOTE — Anesthesia Postprocedure Evaluation (Signed)
Anesthesia Post Note  Patient: Brian Duffy.  Procedure(s) Performed: LEFT ARM ARTERIOVENOUS (AV) FISTULA VERSUS ARTERIOVENOUS GRAFT CREATION (Left Arm Lower)     Patient location during evaluation: PACU Anesthesia Type: MAC Level of consciousness: awake and alert Pain management: pain level controlled Vital Signs Assessment: post-procedure vital signs reviewed and stable Respiratory status: spontaneous breathing, nonlabored ventilation and respiratory function stable Cardiovascular status: stable and blood pressure returned to baseline Anesthetic complications: no   No complications documented.  Last Vitals:  Vitals:   06/27/20 0945 06/27/20 0958  BP: (!) 172/91 (!) 167/78  Pulse: 70 66  Resp: 12 14  Temp: 36.5 C 36.5 C  SpO2: 100% 100%    Last Pain:  Vitals:   06/27/20 0945  TempSrc:   PainSc: 0-No pain                 Audry Pili

## 2020-06-27 NOTE — Anesthesia Procedure Notes (Signed)
Procedure Name: MAC Date/Time: 06/27/2020 7:40 AM Performed by: Lowella Dell, CRNA Pre-anesthesia Checklist: Patient identified, Emergency Drugs available, Suction available, Patient being monitored and Timeout performed Patient Re-evaluated:Patient Re-evaluated prior to induction Oxygen Delivery Method: Simple face mask Placement Confirmation: positive ETCO2 Dental Injury: Teeth and Oropharynx as per pre-operative assessment

## 2020-06-27 NOTE — Progress Notes (Signed)
PROGRESS NOTE    Brian Duffy.  EYC:[id number] DOB: September 10, 1991 DOA: 06/20/2020 PCP: Patient, [name]  Brief Narrative:  29 year old male with no past medical history other than severe loss of appetite nausea came to emergency room Found to have severe azotemia/creatinine 130/24 CO2 13 gap 16 Hemoglobin 5.6 WBC 3 platelet 111 Transfused 2 units PRBC  So far  [-] hep C hep B, ANA, GBM membrane  Had TDC placed and then L Radiocephalic AV fistula  because of low PLT count  [in a setting of heparin on HD use] Oncology was consulted and are recommending work-up including bone marrow biopsy  Assessment & Plan:   Principal Problem:   AKI (acute kidney injury) ([place]) Active Problems:   Uremia   Metabolic acidosis   Normocytic anemia   Thrombocytopenia (HCC)   Protein-calorie malnutrition, severe   Pancytopenia ([place])   1. Bleeding from right chest dialysis access port a. R chest catheter re-sutured 10/10 by IR and stopped bleeding--still having some oozing  2. Anemia of blood loss and renal insufficiency a. transfusing 1 U PRBC 10/11 for hemoglobin 6.4 b. RPT labs in am 3. Severe azotemia with metabolic acidosis probably from renal failure a. Patient has nausea and vomiting probably from azotemia b. Nausea symptoms better after dialysis x1 and Zofran ODT and IV on board c. Rest as per nephrology d. Some improvement 4. Diarrhea-recurrent a. GI path panel 10/6 neg, low suspicion for HUS b. Continue Imodium 5. Recurrent hypocalcemia- Unclear etiology 6. Probably form 2/2 hypoparthyroid-Renal losses a. Replaced with IV Ca gluconate-added tums--corrected calcium however is 8.1 b. Get renal panel in am 7. Severe TCP a. Schistocytes neg b. Drop in plt count down to 50's--this could be HIIT? c. Follow DIC/methylmalonic, DAT, [place] d. oncologist to see e. PLT smear is pending, Myelma panel is pending, Parvovirus f. Also check LDH, Haptoglobin and myeloma panel 8. Nausea  vomiting anorexia a. From renal issues, slight improved 9. BMI 21  DVT prophylaxis: Lovenox Code Status: Full presumed Family Communication: None at bedside--met father on 10/11 Disposition:   Status is: Inpatient  Remains inpatient appropriate because:Hemodynamically unstable, Persistent severe electrolyte disturbances, Unsafe d/c plan and IV treatments appropriate due to intensity of illness or inability to take PO  Dispo: The patient is from: Home              Anticipated d/c is to: Home              Anticipated d/c date is: > 3 days              Patient currently is not medically stable to d/c.  Consultants:   Nephrology  Procedures: None  Antimicrobials: None   Subjective:  Frustrated seems to want to go home No fever no cp no n/v Eating some  Objective: Vitals:   06/27/20 0920 06/27/20 0935 06/27/20 0945 06/27/20 0958  BP: (!) 168/89 (!) 164/98 (!) 172/91 (!) 167/78  Pulse: 78 64 70 66  Resp: $Remo'13 19 12 14  'dDSHt$ Temp: 97.8 F (36.6 C)  97.7 F (36.5 C) 97.7 F (36.5 C)  TempSrc:      SpO2: 99% 100% 100% 100%  Weight:      Height:        Intake/Output Summary (Last 24 hours) at 06/27/2020 1328 Last data filed at 06/27/2020 0920 Gross per 24 hour  Intake 585 ml  Output 250 ml  Net 335 ml   Filed Weights   06/24/20 1807 06/24/20  1951 06/26/20 1100  Weight: 74.1 kg 74.1 kg 72.2 kg    Examination:  General exam: Awake alert nad no distress  Respiratory system: Clear no added sound rales rhonchi The dialysis access area in chest clean and not bleeding now Has new fistula in L forearm Cardiovascular system: S1-S2 no murmur rub or gallop Gastrointestinal system: Soft nontender no rebound no guarding.  Data Reviewed: I have personally reviewed following labs and imaging studies  PLT 96-->84-->52--->46 Hemoglobin 6.9-->7.5 Wbc 4.3 Bun/creat 49/13.9  Radiology Studies: No results found.   Scheduled Meds: . amLODipine  5 mg Oral Daily  . B-complex  with vitamin C  1 tablet Oral Daily  . calcitRIOL  0.25 mcg Oral Daily  . calcium carbonate  400 mg of elemental calcium Oral TID WC  . Chlorhexidine Gluconate Cloth  6 each Topical Q0600  . [START ON 07/01/2020] darbepoetin (ARANESP) injection - DIALYSIS  60 mcg Intravenous Q Sat-HD  . feeding supplement (NEPRO CARB STEADY)  237 mL Oral TID BM  . loperamide  2 mg Oral Q8H  . vitamin B-12  1,000 mcg Oral Daily   Continuous Infusions: . sodium chloride Stopped (06/21/20 0509)  . desmopressin (DDAVP) IV for Bleeding       LOS: 6 days   Time spent: [place], MD [facility] To contact the attending provider between 7A-7P or the covering provider during after hours 7P-7A, please log into the web site [email or web address] and access using universal [place] password for that web site. If you do not have the password, please call the hospital operator.  06/27/2020, 1:28 PM

## 2020-06-27 NOTE — Op Note (Signed)
    OPERATIVE REPORT  DATE OF SURGERY: 06/27/2020  PATIENT: Brian Duffy., 29 y.o. male MRN: [id number]  DOB: [birth year]  PRE-OPERATIVE DIAGNOSIS: End-stage renal disease  POST-OPERATIVE DIAGNOSIS:  Same  PROCEDURE: Left radiocephalic AV fistula  SURGEON:  Curt Jews, M.D.  PHYSICIAN ASSISTANT: Nurse  The assistant was needed for exposure and to expedite the case  ANESTHESIA: Local with sedation  EBL: per anesthesia record  Total I/O In: 250 [I.V.:250] Out: -   BLOOD ADMINISTERED: none  DRAINS: none  SPECIMEN: none  COUNTS CORRECT:  YES  PATIENT DISPOSITION:  PACU - hemodynamically stable  PROCEDURE DETAILS: Patient was taken to room placed in supine position where the area of the left arm was prepped draped you sterile fashion.  The incision was made between the level of the cephalic vein and the radial artery at the wrist.  The cephalic vein was of excellent caliber and tributary branches were ligated and divided.  The vein was ligated distally and divided and was mobilized to the level of the radial artery.  The radial artery was exposed through the same incision.  The radial artery was occluded proximally and distally and was opened with an 11 blade sent longstanding with Potts scissors.  The vein was spatulated and sewn end-to-side to the artery with a running 6-0 Prolene suture.  Prior to completion of the anastomosis the 2 dilator passed proximally and distally without resistance.  The anastomosis was completed and clamps were removed with excellent thrill.  Wounds irrigated with saline.  Hemostasis obtained after cautery.  The wounds were closed with 3-0 Vicryl in the subcutaneous and subcuticular tissue.  Sterile dressing was applied and the patient was transferred to the recovery room in stable condition   Brian Duffy, M.D., [facility] 06/27/2020 9:57 AM

## 2020-06-28 ENCOUNTER — Other Ambulatory Visit: Payer: Self-pay

## 2020-06-28 ENCOUNTER — Encounter (HOSPITAL_COMMUNITY): Payer: Self-pay | Admitting: Vascular Surgery

## 2020-06-28 ENCOUNTER — Inpatient Hospital Stay (HOSPITAL_COMMUNITY): Payer: Medicaid Other

## 2020-06-28 LAB — CBC
HCT: 21.5 % — ABNORMAL LOW (ref 39.0–52.0)
HCT: 26.5 % — ABNORMAL LOW (ref 39.0–52.0)
HCT: 27 % — ABNORMAL LOW (ref 39.0–52.0)
Hemoglobin: 7.2 g/dL — ABNORMAL LOW (ref 13.0–17.0)
Hemoglobin: 8.7 g/dL — ABNORMAL LOW (ref 13.0–17.0)
Hemoglobin: 9.1 g/dL — ABNORMAL LOW (ref 13.0–17.0)
MCH: 31.7 pg (ref 26.0–34.0)
MCH: 29.9 pg (ref 26.0–34.0)
MCH: 30.6 pg (ref 26.0–34.0)
MCHC: 33.5 g/dL (ref 30.0–36.0)
MCHC: 32.8 g/dL (ref 30.0–36.0)
MCHC: 33.7 g/dL (ref 30.0–36.0)
MCV: 94.7 fL (ref 80.0–100.0)
MCV: 91.1 fL (ref 80.0–100.0)
MCV: 90.9 fL (ref 80.0–100.0)
Platelets: 80 10*3/uL — ABNORMAL LOW (ref 150–400)
Platelets: 66 10*3/uL — ABNORMAL LOW (ref 150–400)
Platelets: 74 10*3/uL — ABNORMAL LOW (ref 150–400)
RBC: 2.27 MIL/uL — ABNORMAL LOW (ref 4.22–5.81)
RBC: 2.91 MIL/uL — ABNORMAL LOW (ref 4.22–5.81)
RBC: 2.97 MIL/uL — ABNORMAL LOW (ref 4.22–5.81)
RDW: 14.8 % (ref 11.5–15.5)
RDW: 14.4 % (ref 11.5–15.5)
RDW: 14.3 % (ref 11.5–15.5)
WBC: 5.4 10*3/uL (ref 4.0–10.5)
WBC: 6.1 10*3/uL (ref 4.0–10.5)
WBC: 7.4 10*3/uL (ref 4.0–10.5)
nRBC: 0 % (ref 0.0–0.2)
nRBC: 0 % (ref 0.0–0.2)
nRBC: 0 % (ref 0.0–0.2)

## 2020-06-28 LAB — PREPARE RBC (CROSSMATCH)

## 2020-06-28 LAB — HEPARIN INDUCED PLATELET AB (HIT ANTIBODY): Heparin Induced Plt Ab: 0.162 OD (ref 0.000–0.400)

## 2020-06-28 MED ORDER — HEPARIN SODIUM (PORCINE) 1000 UNIT/ML IJ SOLN
INTRAMUSCULAR | Status: AC
  Administered 2020-06-28: 1000 [IU]
  Filled 2020-06-28: qty 1

## 2020-06-28 MED ORDER — MIDAZOLAM HCL 2 MG/2ML IJ SOLN
INTRAMUSCULAR | Status: AC | PRN
  Administered 2020-06-28 (×2): 1 mg via INTRAVENOUS

## 2020-06-28 MED ORDER — FENTANYL CITRATE (PF) 100 MCG/2ML IJ SOLN
INTRAMUSCULAR | Status: AC | PRN
  Administered 2020-06-28: 50 ug via INTRAVENOUS
  Administered 2020-06-28: 25 ug via INTRAVENOUS

## 2020-06-28 MED ORDER — OXYCODONE-ACETAMINOPHEN 5-325 MG PO TABS
ORAL_TABLET | ORAL | Status: AC
  Filled 2020-06-28: qty 1

## 2020-06-28 MED ORDER — CALCITRIOL 0.25 MCG PO CAPS
ORAL_CAPSULE | ORAL | Status: AC
  Administered 2020-06-28: 0.25 ug via ORAL
  Filled 2020-06-28: qty 1

## 2020-06-28 MED ORDER — SODIUM CHLORIDE 0.9% IV SOLUTION: Freq: Once | INTRAVENOUS | Status: DC

## 2020-06-28 MED ORDER — FENTANYL CITRATE (PF) 100 MCG/2ML IJ SOLN
INTRAMUSCULAR | Status: AC
  Filled 2020-06-28: qty 4

## 2020-06-28 MED ORDER — MIDAZOLAM HCL 2 MG/2ML IJ SOLN
INTRAMUSCULAR | Status: AC
  Filled 2020-06-28: qty 4

## 2020-06-28 MED ORDER — LIDOCAINE HCL 1 % IJ SOLN
INTRAMUSCULAR | Status: AC
  Filled 2020-06-28: qty 20

## 2020-06-28 NOTE — Discharge Instructions (Signed)
° °  [facility] [place] ° °Discharge Instructions ° °AV Fistula or Graft Surgery for Dialysis Access ° °Please refer to the following instructions for your post-procedure care. Your surgeon or physician assistant will discuss any changes with you. ° °Activity ° °You may drive the day following your surgery, if you are comfortable and no longer taking prescription pain medication. Resume full activity as the soreness in your incision resolves. ° °Bathing/Showering ° °You may shower after you go home. Keep your incision dry for 48 hours. Do not soak in a bathtub, hot tub, or swim until the incision heals completely. You may not shower if you have a hemodialysis catheter. ° °Incision Care ° °Clean your incision with mild soap and water after 48 hours. Pat the area dry with a clean towel. You do not need a bandage unless otherwise instructed. Do not apply any ointments or creams to your incision. You may have skin glue on your incision. Do not peel it off. It will come off on its own in about one week. Your arm may swell a bit after surgery. To reduce swelling use pillows to elevate your arm so it is above your heart. Your doctor will tell you if you need to lightly wrap your arm with an ACE bandage. ° °Diet ° °Resume your normal diet. There are not special food restrictions following this procedure. In order to heal from your surgery, it is CRITICAL to get adequate nutrition. Your body requires vitamins, minerals, and protein. Vegetables are the best source of vitamins and minerals. Vegetables also provide the perfect balance of protein. Processed food has little nutritional value, so try to avoid this. ° °Medications ° °Resume taking all of your medications. If your incision is causing pain, you may take over-the counter pain relievers such as acetaminophen (Tylenol). If you were prescribed a stronger pain medication, please be aware these medications can cause nausea and constipation. Prevent  nausea by taking the medication with a snack or meal. Avoid constipation by drinking plenty of fluids and eating foods with high amount of fiber, such as fruits, vegetables, and grains. Do not take Tylenol if you are taking prescription pain medications. ° ° ° ° °Follow up °Your surgeon may want to see you in the office following your access surgery. If so, this will be arranged at the time of your surgery. ° °Please call us immediately for any of the following conditions: ° °Increased pain, redness, drainage (pus) from your incision site °Fever of 101 degrees or higher °Severe or worsening pain at your incision site °Hand pain or numbness. ° °Reduce your risk of vascular disease: ° °Stop smoking. If you would like help, call [facility] at 1-800-QUIT-NOW (1-800-784-8669) or [place] at 336-586-4000 ° °Manage your cholesterol °Maintain a desired weight °Control your diabetes °Keep your blood pressure down ° °Dialysis ° °It will take several weeks to several months for your new dialysis access to be ready for use. Your surgeon will determine when it is OK to use it. Your nephrologist will continue to direct your dialysis. You can continue to use your Permcath until your new access is ready for use. ° °If you have any questions, please call the office at [phone]. ° °

## 2020-06-28 NOTE — Progress Notes (Addendum)
  Progress Note    06/28/2020 7:46 AM 1 Day Post-Op  Subjective:  Denies signs or symptoms of steal L hand   Vitals:   06/27/20 2059 06/28/20 0456  BP: 125/74 (!) 146/91  Pulse: 85 80  Resp: 15   Temp: 98.8 F (37.1 C) 98.5 F (36.9 C)  SpO2: 97% 99%   Physical Exam: Lungs:  Non labored Incisions:  L wrist incision c/d/i without hematoma Extremities:  Grip strength symmetrical; palpable thrill through L cephalic vein throughout forearm Neurologic: A&O  CBC    Component Value Date/Time   WBC 4.6 06/27/2020 0300   RBC 2.52 (L) 06/27/2020 0300   HGB 7.5 (L) 06/27/2020 0300   HCT 23.1 (L) 06/27/2020 0300   PLT 46 (L) 06/27/2020 0300   MCV 91.7 06/27/2020 0300   MCH 29.8 06/27/2020 0300   MCHC 32.5 06/27/2020 0300   RDW 14.7 06/27/2020 0300   LYMPHSABS 0.6 (L) 06/24/2020 0603   MONOABS 0.3 06/24/2020 0603   EOSABS 0.2 06/24/2020 0603   BASOSABS 0.0 06/24/2020 0603    BMET    Component Value Date/Time   NA 139 06/27/2020 0300   K 4.4 06/27/2020 0300   CL 100 06/27/2020 0300   CO2 27 06/27/2020 0300   GLUCOSE 82 06/27/2020 0300   BUN 26 (H) 06/27/2020 0300   CREATININE 9.72 (H) 06/27/2020 0300   CALCIUM 8.4 (L) 06/27/2020 0300   CALCIUM 6.2 06/21/2020 1427   GFRNONAA 7 (L) 06/27/2020 0300    INR    Component Value Date/Time   INR 1.1 06/26/2020 1837     Intake/Output Summary (Last 24 hours) at 06/28/2020 0746 Last data filed at 06/28/2020 0600 Gross per 24 hour  Intake 890 ml  Output 0 ml  Net 890 ml     Assessment/Plan:  29 y.o. male is s/p L radiocephalic fistula creation 1 Day Post-Op   Patent L radiocephalic fistula without steal Check fistula duplex in 4-6 weeks Ok for discharge from vascular standpoint   Brian Ligas, PA-C [facility] [phone] 06/28/2020 7:46 AM

## 2020-06-28 NOTE — Progress Notes (Signed)
Interventional Radiology Brief Note:  HD catheter assessed while patient down for bone marrow biopsy.   Dressing and quick-clot pad removed.  Hemostasis observed.  Removed dirty/saturated gauze.  Replaced bio path and dressed with gauze and tegaderm.  No bleeding observed.  All dressings clean and dry.   Brynda Greathouse, MS RD PA-C 11:21 AM

## 2020-06-28 NOTE — Progress Notes (Signed)
PROGRESS NOTE    Brian Duffy.  BMW:[id number] DOB: [birth year] DOA: 06/20/2020 PCP: Patient, [name]  Brief Narrative:  29 year old male with no past medical history other than severe loss of appetite and nausea came to emergency room, found to have severe azotemia/creatinine 130/24 CO2 13, and pancytopenia Hemoglobin 5.6 WBC 3 platelet 111 -Transfused 2 units of PRBC this admission  So far  [-] hep C hep B, ANA, GBM membrane  Had TDC placed and then L Radiocephalic AV fistula  because of low PLT count  [in a setting of heparin on HD use] Oncology was consulted and are recommending work-up including bone marrow biopsy  Assessment & Plan:   Severe AKI/new ESRD Metabolic acidosis -Admitted with severe uremia, creatinine of 24 and BUN of 130 -Renal ultrasound noted diffuse echogenicity, small atrophic kidneys -Serologies for AKI negative -Etiology remains unclear, now declared ESRD -Has a tunneled dialysis catheter and left arm AV fistula -Clip for outpatient dialysis complete -Nephrology following  Pancytopenia Anemia of chronic disease -Anemia panel with adequate iron stores, consistent with chronic disease however kidney disease would not explain his leukopenia and thrombocytopenia -Heme-onc following -Parvovirus IgG positive, IgM negative -Underwent bone marrow aspiration biopsy today -Will send message to oncology for quick follow-up in case he ends up leaving soon, adamant to discharge as soon as possible -Continue EPO weekly with dialysis  Secondary hyperparathyroidism -Continue calcitriol per renal  Hypertension -Continue amlodipine  DVT prophylaxis: SCDs CODE STATUS: Full code Family communication: No family at bedside Disposition: Not medically stable for discharge, home likely in 48 hours pending work-up for pancytopenia   Consultants:   Nephrology  Procedures: Tunneled dialysis catheter 10/8 in IR  left arm AV fistula 10/12 Dr. Donnetta Hutching Bone marrow  aspiration biopsy 10/13 Dr. Kathlene Cote  Subjective:  -Feels okay, no events overnight, just back from bone marrow biopsy  Objective: Vitals:   06/28/20 0930 06/28/20 0935 06/28/20 1000 06/28/20 1000  BP: (!) 160/115 (!) 158/106 (!) 127/108 (!) 125/108  Pulse: 87 82 87 79  Resp: $Remo'13 16 18 18  'WgDoj$ Temp:   97.6 F (36.4 C) 97.9 F (36.6 C)  TempSrc:   Oral Oral  SpO2: 100% 100% 100% 100%  Weight:      Height:        Intake/Output Summary (Last 24 hours) at 06/28/2020 1257 Last data filed at 06/28/2020 0900 Gross per 24 hour  Intake 640 ml  Output 0 ml  Net 640 ml   Filed Weights   06/24/20 1951 06/26/20 1100 06/27/20 2059  Weight: 74.1 kg 72.2 kg 77.1 kg    Examination:  General exam: Thinly built young male sitting up in bed, AAOx3, no distress HEENT: Right IJ HD catheter noted CVS: S1-S2, regular rate rhythm Lungs: Clear bilaterally Abdomen: Soft, nontender, bowel sounds present Extremities: Left arm AV fistula  Data Reviewed: I have personally reviewed following labs and imaging studies  PLT 96-->84-->52--->46 Hemoglobin 6.9-->7.5 Wbc 4.3 Bun/creat 49/13.9  Radiology Studies: No results found.   Scheduled Meds: . amLODipine  10 mg Oral Daily  . B-complex with vitamin C  1 tablet Oral Daily  . calcitRIOL  0.25 mcg Oral Daily  . calcium carbonate  400 mg of elemental calcium Oral TID WC  . Chlorhexidine Gluconate Cloth  6 each Topical Q0600  . [START ON 07/01/2020] darbepoetin (ARANESP) injection - DIALYSIS  60 mcg Intravenous Q Sat-HD  . feeding supplement (NEPRO CARB STEADY)  237 mL Oral TID BM  . lidocaine      .  loperamide  2 mg Oral Q8H  . vitamin B-12  1,000 mcg Oral Daily   Continuous Infusions: . sodium chloride Stopped (06/21/20 0509)  . desmopressin (DDAVP) IV for Bleeding       LOS: 7 days   Time spent: 48min  Domenic Polite, MD [facility]   06/28/2020, 12:57 PM

## 2020-06-28 NOTE — Progress Notes (Signed)
[facility] Progress Note   Subjective:    NAE, S/p BM Bx today CLIP completed to [facility], MWF   Objective Vitals:   06/28/20 0930 06/28/20 0935 06/28/20 1000 06/28/20 1000  BP: (!) 160/115 (!) 158/106 (!) 127/108 (!) 125/108  Pulse: 87 82 87 79  Resp: $Remo'13 16 18 18  'lKMYL$ Temp:   97.6 F (36.4 C) 97.9 F (36.6 C)  TempSrc:   Oral Oral  SpO2: 100% 100% 100% 100%  Weight:      Height:       Physical Exam General: comfortable Heart: RRR Lungs:clear Extremities: no edema Neuro: nonfocal Psych:  Normal mood and affect in circumstances Dialysis access:  [facility] with dressing c/d/i; L RC AVF +B/T  Additional Objective Labs: Basic Metabolic Panel: Recent Labs  Lab 06/21/20 1427 06/22/20 0052 06/25/20 0105 06/26/20 0804 06/27/20 0300  NA 142   < > 140 141 139  K 4.4   < > 3.8 4.4 4.4  CL 110   < > 103 104 100  CO2 15*   < > $R'28 25 27  'pw$ GLUCOSE 80   < > 94 110* 82  BUN 123*   < > 40* 49* 26*  CREATININE 22.42*   < > 11.35* 13.98* 9.72*  CALCIUM 6.3*  6.2   < > 7.1* 7.9* 8.4*  PHOS 10.3*  --  4.4 6.3*  --    < > = values in this interval not displayed.   Liver Function Tests: Recent Labs  Lab 06/22/20 0052 06/22/20 0052 06/23/20 0249 06/23/20 0249 06/24/20 0603 06/25/20 0105 06/26/20 0804  AST 11*  --  13*  --  12*  --   --   ALT 15  --  17  --  13  --   --   ALKPHOS 28*  --  31*  --  28*  --   --   BILITOT 0.9  --  0.8  --  0.8  --   --   PROT 5.2*  --  5.8*  --  5.7*  --   --   ALBUMIN 3.3*   < > 3.5   < > 3.5 3.1* 3.3*   < > = values in this interval not displayed.   No results for input(s): LIPASE, AMYLASE in the last 168 hours. CBC: Recent Labs  Lab 06/21/20 1427 06/21/20 1427 06/22/20 0052 06/22/20 0052 06/24/20 0603 06/24/20 0603 06/24/20 2218 06/24/20 2218 06/25/20 0105 06/25/20 0105 06/26/20 0804 06/26/20 1837 06/27/20 0300  WBC 2.7*   < > 4.1   < > 4.2   < > 3.5*   < > 3.7*  --  4.3  --  4.6  NEUTROABS 1.4*  --  2.4  --   3.1  --   --   --   --   --   --   --   --   HGB 7.0*   < > 7.5*   < > 7.9*   < > 7.3*   < > 7.1*  --  6.9*  --  7.5*  HCT 20.9*   < > 22.0*   < > 23.9*   < > 22.2*   < > 21.5*  --  20.9*  --  23.1*  MCV 92.5   < > 92.4   < > 91.2  --  91.0  --  91.5  --  94.6  --  91.7  PLT 96*   < > 96*   < >  84*   < > 53*   < > 54*   < > 52* 44* 46*   < > = values in this interval not displayed.   Blood Culture No results found for: SDES, SPECREQUEST, CULT, REPTSTATUS  Cardiac Enzymes: No results for input(s): CKTOTAL, CKMB, CKMBINDEX, TROPONINI in the last 168 hours. CBG: No results for input(s): GLUCAP in the last 168 hours. Iron Studies:  No results for input(s): IRON, TIBC, TRANSFERRIN, FERRITIN in the last 72 hours. $RemoveB'@lablastinr3'IhLAnZxJ$ @ Studies/Results: CT BONE MARROW BIOPSY & ASPIRATION  Result Date: 06/28/2020 CLINICAL DATA:  Renal failure, thrombocytopenia, leukopenia and anemia. Need for bone marrow biopsy. EXAM: CT GUIDED BONE MARROW ASPIRATION AND BIOPSY ANESTHESIA/SEDATION: Versed 2.0 mg IV, Fentanyl 75 mcg IV Total Moderate Sedation Time:   15 minutes. The patient's level of consciousness and physiologic status were continuously monitored during the procedure by Radiology nursing. PROCEDURE: The procedure risks, benefits, and alternatives were explained to the patient. Questions regarding the procedure were encouraged and answered. The patient understands and consents to the procedure. A time out was performed prior to initiating the procedure. The right gluteal region was prepped with chlorhexidine. Sterile gown and sterile gloves were used for the procedure. Local anesthesia was provided with 1% Lidocaine. Under CT guidance, an 11 gauge On Control bone cutting needle was advanced from a posterior approach into the right iliac bone. Needle positioning was confirmed with CT. Initial non heparinized and heparinized aspirate samples were obtained of bone marrow. Core biopsy was performed via the On  Control drill needle. COMPLICATIONS: None FINDINGS: Inspection of initial aspirate did reveal visible particles. Intact core biopsy sample was obtained. IMPRESSION: CT guided bone marrow biopsy of right posterior iliac bone with both aspirate and core samples obtained. Electronically Signed   By: Aletta Edouard M.D.   On: 06/28/2020 13:03   Medications: . sodium chloride Stopped (06/21/20 0509)  . desmopressin (DDAVP) IV for Bleeding     . amLODipine  10 mg Oral Daily  . B-complex with vitamin C  1 tablet Oral Daily  . calcitRIOL  0.25 mcg Oral Daily  . calcium carbonate  400 mg of elemental calcium Oral TID WC  . Chlorhexidine Gluconate Cloth  6 each Topical Q0600  . [START ON 07/01/2020] darbepoetin (ARANESP) injection - DIALYSIS  60 mcg Intravenous Q Sat-HD  . feeding supplement (NEPRO CARB STEADY)  237 mL Oral TID BM  . lidocaine      . loperamide  2 mg Oral Q8H  . vitamin B-12  1,000 mcg Oral Daily    Assessment/Plan: **ESRD:  I suspect this represents ESRD given labs and appearance of kidneys on [place] with diffuse echogenicity, small, atrophic appearance.  Serologies all negative. Oncology eval for cytopenias in process.  Has TDC and L RC AVF. CLIP complete.  Cont on MWF, will be MWF at [facility]  No inpatient renal issues, ok for DC after HD today.    **HTN:  Labile, at times elevated, amlodipine. Trend with HD at outpt unit  **Pancytopenia:  Anemia expected with CKD but not pancytopenia.  Iron ok, started ESA. HIV neg.  Heme following.  No heparin with HD for now.   **Metabolic acidosis: follow  **N/V/diarrhea:  Suspect mostly uremia; Gi pathogen panel neg.. imnproved  **secondary hyperPTH: concomitant hypoCa.  PTH 479.  calcitriol 0.25 daily.  On ca carbonate TIDAC for binder --> would change to nonca based binder long term but for now in context of hypoCa cont.   Meredith Leeds  Treyvone Chelf  06/28/2020, 1:41 PM  [employer]

## 2020-06-28 NOTE — Procedures (Signed)
Interventional Radiology Procedure Note  Procedure: CT guided bone marrow aspiration and biopsy  Complications: None  EBL: < 10 mL  Findings: Aspirate and core biopsy performed of bone marrow in right iliac bone.  Plan: Bedrest supine x 1 hrs  Aquila Menzie T. Kayce Betty, M.D Pager:  [phone]   

## 2020-06-29 LAB — COMPREHENSIVE METABOLIC PANEL
ALT: 7 U/L (ref 0–44)
AST: 21 U/L (ref 15–41)
Albumin: 3.1 g/dL — ABNORMAL LOW (ref 3.5–5.0)
Alkaline Phosphatase: 31 U/L — ABNORMAL LOW (ref 38–126)
Anion gap: 11 (ref 5–15)
BUN: 18 mg/dL (ref 6–20)
CO2: 26 mmol/L (ref 22–32)
Calcium: 7.8 mg/dL — ABNORMAL LOW (ref 8.9–10.3)
Chloride: 101 mmol/L (ref 98–111)
Creatinine, Ser: 7.46 mg/dL — ABNORMAL HIGH (ref 0.61–1.24)
GFR, Estimated: 9 mL/min — ABNORMAL LOW (ref >60)
Glucose, Bld: 90 mg/dL (ref 70–99)
Potassium: 4.2 mmol/L (ref 3.5–5.1)
Sodium: 138 mmol/L (ref 135–145)
Total Bilirubin: 0.3 mg/dL (ref 0.3–1.2)
Total Protein: 5.6 g/dL — ABNORMAL LOW (ref 6.5–8.1)

## 2020-06-29 LAB — BPAM RBC
Blood Product Expiration Date: 202111012359
Blood Product Expiration Date: 202111052359
ISSUE DATE / TIME: 202110111149
ISSUE DATE / TIME: 202110131713
Unit Type and Rh: 6200
Unit Type and Rh: 6200

## 2020-06-29 LAB — CBC
HCT: 25.3 % — ABNORMAL LOW (ref 39.0–52.0)
Hemoglobin: 8.3 g/dL — ABNORMAL LOW (ref 13.0–17.0)
MCH: 30.1 pg (ref 26.0–34.0)
MCHC: 32.8 g/dL (ref 30.0–36.0)
MCV: 91.7 fL (ref 80.0–100.0)
Platelets: 80 10*3/uL — ABNORMAL LOW (ref 150–400)
RBC: 2.76 MIL/uL — ABNORMAL LOW (ref 4.22–5.81)
RDW: 14.4 % (ref 11.5–15.5)
WBC: 6.4 10*3/uL (ref 4.0–10.5)
nRBC: 0 % (ref 0.0–0.2)

## 2020-06-29 LAB — TYPE AND SCREEN
ABO/RH(D): A POS
Antibody Screen: NEGATIVE
Unit division: 0
Unit division: 0

## 2020-06-29 MED ORDER — AMLODIPINE BESYLATE 10 MG PO TABS: 10.0000 mg | ORAL_TABLET | Freq: Every day | ORAL | 1 refills | Status: DC

## 2020-06-29 MED ORDER — CYANOCOBALAMIN 1000 MCG PO TABS: 1000.0000 ug | ORAL_TABLET | Freq: Every day | ORAL | 0 refills | Status: AC

## 2020-06-29 MED ORDER — CALCITRIOL 0.25 MCG PO CAPS: 0.2500 ug | ORAL_CAPSULE | Freq: Every day | ORAL | 0 refills | Status: AC

## 2020-06-29 MED ORDER — LOSARTAN POTASSIUM 50 MG PO TABS: 50.0000 mg | ORAL_TABLET | Freq: Every day | ORAL | Status: DC

## 2020-06-29 MED ORDER — POLYETHYLENE GLYCOL 3350 17 G PO PACK
17.0000 g | PACK | Freq: Every day | ORAL | Status: DC
  Administered 2020-06-29: 17 g via ORAL
  Filled 2020-06-29: qty 1

## 2020-06-29 MED ORDER — OXYCODONE-ACETAMINOPHEN 5-325 MG PO TABS: 1.0000 | ORAL_TABLET | Freq: Four times a day (QID) | ORAL | 0 refills | Status: AC | PRN

## 2020-06-29 NOTE — Progress Notes (Signed)
DISCHARGE NOTE HOME Brian Duffy. to be discharged home per MD order. Discussed prescriptions and follow up appointments with the patient. Prescriptions given to patient; medication list explained in detail. Patient verbalized understanding.  Skin clean, dry and intact without evidence of skin break down, no evidence of skin tears noted. IV catheter discontinued intact. Site without signs and symptoms of complications. Dressing and pressure applied. Pt denies pain at the site currently. No complaints noted.  Patient free of lines, drains, and wounds.   An After Visit Summary (AVS) was printed and given to the patient. Patient escorted via wheelchair, and discharged home via private auto.  Pierre Dellarocco S Ambria [place], RN

## 2020-06-29 NOTE — TOC Transition Note (Signed)
Transition of Care [facility]) - CM/SW Discharge Note   Patient Details  Name: Brian Duffy. MRN: [id number] Date of Birth: [birth year]  Transition of Care [facility]) CM/SW Contact:  Bartholomew Crews, RN Phone Number: [phone] 06/29/2020, 10:17 AM   Clinical Narrative:     Patient to transition home today. Spoke with patient at the bedside concerning need for PCP. Patient agreeable to hospital follow up appointment at [facility] - scheduled for Thursday, 11/4 9:30 am (this was first available appointment on a non dialysis day). No further TOC needs identified.   Final next level of care: Home/Self Care Barriers to Discharge: No Barriers Identified   Patient Goals and CMS Choice Patient states their goals for this hospitalization and ongoing recovery are:: return home CMS Medicare.gov Compare Post Acute Care list provided to:: Patient Choice offered to / list presented to : NA  Discharge Placement                       Discharge Plan and Services                DME Arranged: N/A DME Agency: NA       HH Arranged: NA HH Agency: NA        Social Determinants of Health (SDOH) Interventions     Readmission Risk Interventions No flowsheet data found.

## 2020-06-29 NOTE — Discharge Summary (Signed)
Physician Discharge Summary  [name]. LEX:[id number] DOB: 03-May-1991 DOA: 06/20/2020  PCP: Patient, [name]  Admit date: 06/20/2020 Discharge date: 06/29/2020  Time spent: 35 minutes  Recommendations for Outpatient Follow-up:  1. Hematology Dr.Kale at [facility] in 2 weeks, to follow-up bone marrow biopsy 2. Please check CBC in 1 week 3. Outpatient hemodialysis Monday Wednesday Friday 4. Vascular surgery Dr. Donnetta Hutching in 4 weeks   Discharge Diagnoses:  Principal Problem: Severe AKI (acute kidney injury) ([place])   Uremia   Metabolic acidosis   Normocytic anemia   Thrombocytopenia ([facility])   Protein-calorie malnutrition, severe   Pancytopenia ([place])   Discharge Condition: Stable Diet recommendation: Renal  Filed Weights   06/28/20 1458 06/28/20 1835 06/28/20 2027  Weight: 72 kg 71 kg 71 kg    History of present illness:  29 year old male with no past medical history other than severe loss of appetite and nausea came to emergency room, found to have severe azotemia/creatinine 130/24 CO2 13, and pancytopenia Hemoglobin 5.6 WBC 3 platelet 111  Hospital Course:   Severe AKI/new ESRD Metabolic acidosis -Admitted with severe uremia, creatinine of 24 and BUN of 130 -Followed by nephrology this admission  -renal ultrasound noted diffuse echogenicity, small atrophic kidneys on admission -Serologies for AKI were negative (including GBM antibody, C4, C3 was mildly low, ANA, PNC ANCA were negative, hepatitis B surface antigen negative, HIV negative) -Etiology remains unclear, now declared ESRD -Had a tunneled dialysis catheter placed and left arm AV fistula -Clip for outpatient dialysis complete -Be discharged home today in a stable condition, patient has been educated about renal diet and importance of compliance with dialysis, next HD at his dialysis center tomorrow Friday 10/15  Pancytopenia Anemia of chronic disease -Admitted with a hemoglobin of 5.6, white  count of 3 and platelet count of 110, no prior labs ever done per patient report -anemia panel with adequate iron stores, consistent with chronic disease however kidney disease would not explain his leukopenia and thrombocytopenia -Heme-onc consulted, HIT antibody was negative, B12 was slightly low at 277, methylmalonic acid level and copper level are pending, started on B12 replacement -Parvovirus IgG positive, IgM negative, haptoglobin was normal, LDH was only minimally elevated, myeloma panel was unremarkable -Underwent bone marrow aspiration biopsy yesterday -Transfused 3 units of PRBC this admission -Patient is adamant to discharge home ASAP and not willing to stay until definitive diagnosis is clear, he reiterates that he will follow-up with hematology next week -I sent a message to hematology yesterday, they will arrange a quick follow-up with labs with Dr. Irene Limbo at the cancer center  -Continue EPO weekly with dialysis   Secondary hyperparathyroidism -Continue calcitriol per renal  Hypertension -Continue amlodipine  Consultants:   Nephrology  IR  Hematology  Vascular surgery  Procedures: -Tunneled dialysis catheter 10/8 in IR -left arm AV fistula 10/12 Dr. Donnetta Hutching -Bone marrow aspiration biopsy 10/13 Dr. Kathlene Cote  Discharge Exam: Vitals:   06/28/20 2027 06/29/20 0642  BP: (!) 155/100 (!) 160/97  Pulse: 95 90  Resp: 18 18  Temp: 99.5 F (37.5 C) 98.4 F (36.9 C)  SpO2: 100% 100%    General: Awake alert oriented x3 Cardiovascular: S1-S2, regular rate rhythm Respiratory: Clear  Discharge Instructions   Discharge Instructions    Diet - low sodium heart healthy   Complete by: As directed    Increase activity slowly   Complete by: As directed    No dressing needed   Complete by: As directed  Allergies as of 06/29/2020   No Known Allergies     Medication List    TAKE these medications   amLODipine 10 MG tablet Commonly known as: NORVASC Take 1  tablet (10 mg total) by mouth daily. Start taking on: June 30, 2020   calcitRIOL 0.25 MCG capsule Commonly known as: ROCALTROL Take 1 capsule (0.25 mcg total) by mouth daily. Start taking on: June 30, 2020   cyanocobalamin 1000 MCG tablet Take 1 tablet (1,000 mcg total) by mouth daily. Start taking on: June 30, 2020   oxyCODONE-acetaminophen 5-325 MG tablet Commonly known as: Percocet Take 1 tablet by mouth every 6 (six) hours as needed for up to 5 days for severe pain.            Discharge Care Instructions  (From admission, onward)         Start     Ordered   06/29/20 0000  No dressing needed        06/29/20 1030         No Known Allergies  Follow-up Information    Early, Arvilla Meres, MD In 4 weeks.   Specialties: Vascular Surgery, Cardiology Why: Office will call you to arrange your appt (sent) Contact information: [place] [address] [place] Follow up on 07/20/2020.   Why: at 9:30am Contact information: [place] [address] [phone]       Brunetta Genera, MD. Go in 2 week(s).   Specialties: Hematology, Oncology Why: Please Follow up with Dr.Kale at the Cancer center for your bone marrow biopsy results Contact information: [place] [address] [phone]                The results of significant diagnostics from this hospitalization (including imaging, microbiology, ancillary and laboratory) are listed below for reference.    Significant Diagnostic Studies: US Renal  Result Date: 06/21/2020 CLINICAL DATA:  Acute kidney injury EXAM: RENAL / URINARY TRACT ULTRASOUND COMPLETE COMPARISON:  None. FINDINGS: Right Kidney: Renal measurements: 8.7 x 3.6 x 4.7 cm = volume: 77 mL. There is diffusely increased echogenicity with atrophic appearance. Several anechoic cysts are seen within the right kidney the  largest measuring 2.8 x 2.1 x 2.6 cm. Left Kidney: Renal measurements: 8.8 x 5.7 x 5.3 cm = volume: 140 mL. Diffusely increased echogenicity with a slightly atrophic appearance. Several anechoic cysts are seen within the left kidney measuring 3.2 x 2.0 x 2.8 cm Bladder: Appears normal for degree of bladder distention. Other: None. IMPRESSION: Diffusely increased parenchymal echogenicity with several anechoic cyst. This could be due to medical renal disease with possible polycystic kidneys. Electronically Signed   By: Prudencio Pair M.D.   On: 06/21/2020 00:45   IR Fluoro Guide CV Line Right  Result Date: 06/23/2020 INDICATION: 29 year old male with end-stage renal disease requiring dialysis. EXAM: TUNNELED PICC LINE WITH ULTRASOUND AND FLUOROSCOPIC GUIDANCE MEDICATIONS: Ancef IV. The antibiotic was given in an appropriate time interval prior to skin puncture. ANESTHESIA/SEDATION: Versed 2 mg IV; Fentanyl 75 mcg IV; Moderate Sedation Time:  23 minutes The patient was continuously monitored during the procedure by the interventional radiology nurse under my direct supervision. FLUOROSCOPY TIME:  Fluoroscopy Time: 0 minutes 42 seconds ( mGy). COMPLICATIONS: None immediate. PROCEDURE: Informed written consent was obtained from the patient after a discussion of the risks, benefits, and alternatives to treatment. Questions regarding the  procedure were encouraged and answered. The right neck and chest were prepped with chlorhexidine in a sterile fashion, and a sterile drape was applied covering the operative field. Maximum barrier sterile technique with sterile gowns and gloves were used for the procedure. A timeout was performed prior to the initiation of the procedure. After creating a small venotomy incision, a micropuncture kit was utilized to access the right internal jugular vein under direct, real-time ultrasound guidance after the overlying soft tissues were anesthetized with 1% lidocaine with epinephrine.  Ultrasound image documentation was performed. The microwire was kinked to measure appropriate catheter length. The micropuncture sheath was exchanged for a peel-away sheath over a guidewire. A 14 French dual lumen tunneled dialysis catheter measuring 23 cm tip to cuff cm was tunneled in a retrograde fashion from the anterior chest wall to the venotomy incision. The catheter was then placed through the peel-away sheath with tip ultimately positioned at the superior caval-atrial junction. Final catheter positioning was confirmed and documented with a spot radiographic image. The catheter aspirates and flushes normally. The catheter was flushed with appropriate volume heparin dwells. The catheter exit site was secured with a 0-Prolene retention suture. The venotomy incision was closed with Dermabond. Dressings were applied. The patient tolerated the procedure well without immediate post procedural complication. FINDINGS: After catheter placement, the tip lies within the superior cavoatrial junction. The catheter aspirates and flushes normally and is ready for immediate use. IMPRESSION: Successful placement of 23 cm tip to cuffcm dual lumen tunneled dialysis catheter catheter via the right internal jugular vein with tip terminating at the superior caval atrial junction. The catheter is ready for immediate use. Electronically Signed   By: Constance Holster M.D.   On: 06/23/2020 18:48   IR US Guide Vasc Access Right  Result Date: 06/23/2020 INDICATION: 29 year old male with end-stage renal disease requiring dialysis. EXAM: TUNNELED PICC LINE WITH ULTRASOUND AND FLUOROSCOPIC GUIDANCE MEDICATIONS: Ancef IV. The antibiotic was given in an appropriate time interval prior to skin puncture. ANESTHESIA/SEDATION: Versed 2 mg IV; Fentanyl 75 mcg IV; Moderate Sedation Time:  23 minutes The patient was continuously monitored during the procedure by the interventional radiology nurse under my direct supervision. FLUOROSCOPY  TIME:  Fluoroscopy Time: 0 minutes 42 seconds ( mGy). COMPLICATIONS: None immediate. PROCEDURE: Informed written consent was obtained from the patient after a discussion of the risks, benefits, and alternatives to treatment. Questions regarding the procedure were encouraged and answered. The right neck and chest were prepped with chlorhexidine in a sterile fashion, and a sterile drape was applied covering the operative field. Maximum barrier sterile technique with sterile gowns and gloves were used for the procedure. A timeout was performed prior to the initiation of the procedure. After creating a small venotomy incision, a micropuncture kit was utilized to access the right internal jugular vein under direct, real-time ultrasound guidance after the overlying soft tissues were anesthetized with 1% lidocaine with epinephrine. Ultrasound image documentation was performed. The microwire was kinked to measure appropriate catheter length. The micropuncture sheath was exchanged for a peel-away sheath over a guidewire. A 14 French dual lumen tunneled dialysis catheter measuring 23 cm tip to cuff cm was tunneled in a retrograde fashion from the anterior chest wall to the venotomy incision. The catheter was then placed through the peel-away sheath with tip ultimately positioned at the superior caval-atrial junction. Final catheter positioning was confirmed and documented with a spot radiographic image. The catheter aspirates and flushes normally. The catheter was flushed with appropriate volume  heparin dwells. The catheter exit site was secured with a 0-Prolene retention suture. The venotomy incision was closed with Dermabond. Dressings were applied. The patient tolerated the procedure well without immediate post procedural complication. FINDINGS: After catheter placement, the tip lies within the superior cavoatrial junction. The catheter aspirates and flushes normally and is ready for immediate use. IMPRESSION: Successful  placement of 23 cm tip to cuffcm dual lumen tunneled dialysis catheter catheter via the right internal jugular vein with tip terminating at the superior caval atrial junction. The catheter is ready for immediate use. Electronically Signed   By: Constance Holster M.D.   On: 06/23/2020 18:48   CT BONE MARROW BIOPSY & ASPIRATION  Result Date: 06/28/2020 CLINICAL DATA:  Renal failure, thrombocytopenia, leukopenia and anemia. Need for bone marrow biopsy. EXAM: CT GUIDED BONE MARROW ASPIRATION AND BIOPSY ANESTHESIA/SEDATION: Versed 2.0 mg IV, Fentanyl 75 mcg IV Total Moderate Sedation Time:   15 minutes. The patient's level of consciousness and physiologic status were continuously monitored during the procedure by Radiology nursing. PROCEDURE: The procedure risks, benefits, and alternatives were explained to the patient. Questions regarding the procedure were encouraged and answered. The patient understands and consents to the procedure. A time out was performed prior to initiating the procedure. The right gluteal region was prepped with chlorhexidine. Sterile gown and sterile gloves were used for the procedure. Local anesthesia was provided with 1% Lidocaine. Under CT guidance, an 11 gauge On Control bone cutting needle was advanced from a posterior approach into the right iliac bone. Needle positioning was confirmed with CT. Initial non heparinized and heparinized aspirate samples were obtained of bone marrow. Core biopsy was performed via the On Control drill needle. COMPLICATIONS: None FINDINGS: Inspection of initial aspirate did reveal visible particles. Intact core biopsy sample was obtained. IMPRESSION: CT guided bone marrow biopsy of right posterior iliac bone with both aspirate and core samples obtained. Electronically Signed   By: Aletta Edouard M.D.   On: 06/28/2020 13:03   VAS [place] UPPER EXT VEIN MAPPING (PRE-OP AVF)  Result Date: 06/25/2020 UPPER EXTREMITY VEIN MAPPING  Indications: Pre Dialysis  access. Limitations: superficial of veins, tissue properties Comparison Study: No prior study on file Performing Technologist: Sharion Dove RVS  Examination Guidelines: A complete evaluation includes B-mode imaging, spectral Doppler, color Doppler, and power Doppler as needed of all accessible portions of each vessel. Bilateral testing is considered an integral part of a complete examination. Limited examinations for reoccurring indications may be performed as noted. +-----------------+-------------+----------+---------+ Right Cephalic   Diameter (cm)Depth (cm)Findings  +-----------------+-------------+----------+---------+ Prox upper arm       0.31        0.16             +-----------------+-------------+----------+---------+ Mid upper arm        0.33        0.19             +-----------------+-------------+----------+---------+ Dist upper arm       0.31        0.16   branching +-----------------+-------------+----------+---------+ Antecubital fossa    0.32        0.18             +-----------------+-------------+----------+---------+ Prox forearm         0.19        0.13   branching +-----------------+-------------+----------+---------+ Mid forearm          0.23        0.22             +-----------------+-------------+----------+---------+  Wrist                0.24        0.27             +-----------------+-------------+----------+---------+ +-----------------+-------------+----------+---------+ Left Cephalic    Diameter (cm)Depth (cm)Findings  +-----------------+-------------+----------+---------+ Prox upper arm       0.38        0.21             +-----------------+-------------+----------+---------+ Mid upper arm        0.48        0.17   branching +-----------------+-------------+----------+---------+ Dist upper arm       0.44        0.17             +-----------------+-------------+----------+---------+ Antecubital fossa    0.51        0.42              +-----------------+-------------+----------+---------+ Prox forearm         0.37        0.36             +-----------------+-------------+----------+---------+ Mid forearm          0.29        0.27   branching +-----------------+-------------+----------+---------+ Dist forearm         0.19        0.30             +-----------------+-------------+----------+---------+ Wrist                0.29        0.20             +-----------------+-------------+----------+---------+ +-----------------+-------------+----------+--------------+ Left Basilic     Diameter (cm)Depth (cm)   Findings    +-----------------+-------------+----------+--------------+ Mid upper arm        0.30        0.34       origin     +-----------------+-------------+----------+--------------+ Dist upper arm       0.30        0.26     branching    +-----------------+-------------+----------+--------------+ Antecubital fossa    0.27        0.17                  +-----------------+-------------+----------+--------------+ Prox forearm         0.35        0.20                  +-----------------+-------------+----------+--------------+ Mid forearm          0.27        0.47     branching    +-----------------+-------------+----------+--------------+ Distal forearm       0.22        0.10                  +-----------------+-------------+----------+--------------+ Wrist                                   not visualized +-----------------+-------------+----------+--------------+ *See table(s) above for measurements and observations.  Diagnosing physician: Servando Snare MD Electronically signed by Servando Snare MD on 06/25/2020 at 11:31:50 AM.    Final     Microbiology: Recent Results (from the past 240 hour(s))  Respiratory Panel by RT PCR (Flu A&B, Covid) - Nasopharyngeal Swab     Status: None   Collection Time: 06/20/20 11:03 PM  Specimen: Nasopharyngeal Swab  Result Value Ref Range  Status   SARS Coronavirus 2 by RT PCR NEGATIVE NEGATIVE Final    Comment: (NOTE) SARS-CoV-2 target nucleic acids are NOT DETECTED.  The SARS-CoV-2 RNA is generally detectable in upper respiratoy specimens during the acute phase of infection. The lowest concentration of SARS-CoV-2 viral copies this assay can detect is 131 copies/mL. A negative result does not preclude SARS-Cov-2 infection and should not be used as the sole basis for treatment or other patient management decisions. A negative result may occur with  improper specimen collection/handling, submission of specimen other than nasopharyngeal swab, presence of viral mutation(s) within the areas targeted by this assay, and inadequate number of viral copies (<131 copies/mL). A negative result must be combined with clinical observations, patient history, and epidemiological information. The expected result is Negative.  Fact Sheet for Patients:  PinkCheek.be  Fact Sheet for Healthcare Providers:  GravelBags.it  This test is no t yet approved or cleared by the [place] FDA and  has been authorized for detection and/or diagnosis of SARS-CoV-2 by FDA under an Emergency Use Authorization (EUA). This EUA will remain  in effect (meaning this test can be used) for the duration of the COVID-19 declaration under Section 564(b)(1) of the Act, 21 U.S.C. section 360bbb-3(b)(1), unless the authorization is terminated or revoked sooner.     Influenza A by PCR NEGATIVE NEGATIVE Final   Influenza B by PCR NEGATIVE NEGATIVE Final    Comment: (NOTE) The Xpert Xpress SARS-CoV-2/FLU/RSV assay is intended as an aid in  the diagnosis of influenza from Nasopharyngeal swab specimens and  should not be used as a sole basis for treatment. Nasal washings and  aspirates are unacceptable for Xpert Xpress SARS-CoV-2/FLU/RSV  testing.  Fact Sheet for  Patients: PinkCheek.be  Fact Sheet for Healthcare Providers: GravelBags.it  This test is not yet approved or cleared by the [place] FDA and  has been authorized for detection and/or diagnosis of SARS-CoV-2 by  FDA under an Emergency Use Authorization (EUA). This EUA will remain  in effect (meaning this test can be used) for the duration of the  Covid-19 declaration under Section 564(b)(1) of the Act, 21  U.S.C. section 360bbb-3(b)(1), unless the authorization is  terminated or revoked. Performed at [facility], [place] [address] [place], [place] [address]   Gastrointestinal Panel by PCR , Stool     Status: None   Collection Time: 06/21/20  7:11 PM   Specimen: Stool  Result Value Ref Range Status   Campylobacter species NOT DETECTED NOT DETECTED Final   Plesimonas shigelloides NOT DETECTED NOT DETECTED Final   Salmonella species NOT DETECTED NOT DETECTED Final   Yersinia enterocolitica NOT DETECTED NOT DETECTED Final   Vibrio species NOT DETECTED NOT DETECTED Final   Vibrio cholerae NOT DETECTED NOT DETECTED Final   Enteroaggregative E coli (EAEC) NOT DETECTED NOT DETECTED Final   Enteropathogenic E coli (EPEC) NOT DETECTED NOT DETECTED Final   Enterotoxigenic E coli (ETEC) NOT DETECTED NOT DETECTED Final   Shiga like toxin producing E coli (STEC) NOT DETECTED NOT DETECTED Final   Shigella/Enteroinvasive E coli (EIEC) NOT DETECTED NOT DETECTED Final   Cryptosporidium NOT DETECTED NOT DETECTED Final   Cyclospora cayetanensis NOT DETECTED NOT DETECTED Final   Entamoeba histolytica NOT DETECTED NOT DETECTED Final   Giardia lamblia NOT DETECTED NOT DETECTED Final   Adenovirus F40/41 NOT DETECTED NOT DETECTED Final   Astrovirus NOT DETECTED NOT DETECTED Final   Norovirus  GI/GII NOT DETECTED NOT DETECTED Final   Rotavirus A NOT DETECTED NOT DETECTED Final   Sapovirus (I, II, IV, and V) NOT DETECTED NOT DETECTED Final     Comment: Performed at [facility], [address], [place], [place] [address]  Surgical pcr screen     Status: None   Collection Time: 06/27/20 12:17 AM   Specimen: Nasal Mucosa; Nasal Swab  Result Value Ref Range Status   MRSA, PCR NEGATIVE NEGATIVE Final   Staphylococcus aureus NEGATIVE NEGATIVE Final    Comment: (NOTE) The Xpert SA Assay (FDA approved for NASAL specimens in patients 33 years of age and older), is one component of a comprehensive surveillance program. It is not intended to diagnose infection nor to guide or monitor treatment. Performed at [facility], [place] [address] [place], [place] [address]      Labs: Basic Metabolic Panel: Recent Labs  Lab 06/24/20 0603 06/25/20 0105 06/26/20 0804 06/27/20 0300 06/29/20 0136  NA 140 140 141 139 138  K 3.4* 3.8 4.4 4.4 4.2  CL 104 103 104 100 101  CO2 _0 GLUCOSE 96 94 110* 82 90  BUN 73* 40* 49* 26* 18  CREATININE 15.78* 11.35* 13.98* 9.72* 7.46*  CALCIUM 6.9* 7.1* 7.9* 8.4* 7.8*  PHOS  --  4.4 6.3*  --   --    Liver Function Tests: Recent Labs  Lab 06/23/20 0249 06/24/20 0603 06/25/20 0105 06/26/20 0804 06/29/20 0136  AST 13* 12*  --   --  21  ALT 17 13  --   --  7  ALKPHOS 31* 28*  --   --  31*  BILITOT 0.8 0.8  --   --  0.3  PROT 5.8* 5.7*  --   --  5.6*  ALBUMIN 3.5 3.5 3.1* 3.3* 3.1*   No results for input(s): LIPASE, AMYLASE in the last 168 hours. No results for input(s): AMMONIA in the last 168 hours. CBC: Recent Labs  Lab 06/24/20 0603 06/24/20 2218 06/27/20 0300 06/28/20 1500 06/28/20 1835 06/28/20 2001 06/29/20 0136  WBC 4.2   < > 4.6 5.4 6.1 7.4 6.4  NEUTROABS 3.1  --   --   --   --   --   --   HGB 7.9*   < > 7.5* 7.2* 8.7* 9.1* 8.3*  HCT 23.9*   < > 23.1* 21.5* 26.5* 27.0* 25.3*  MCV 91.2   < > 91.7 94.7 91.1 90.9 91.7  PLT 84*   < > 46* 80* 66* 74* 80*   < > = values in this interval not displayed.   Cardiac Enzymes: No results for input(s):  CKTOTAL, CKMB, CKMBINDEX, TROPONINI in the last 168 hours. BNP: BNP (last 3 results) No results for input(s): BNP in the last 8760 hours.  ProBNP (last 3 results) No results for input(s): PROBNP in the last 8760 hours.  CBG: No results for input(s): GLUCAP in the last 168 hours.     Signed:  Domenic Polite MD.  [facility] 06/29/2020, 3:09 PM

## 2020-06-29 NOTE — Progress Notes (Signed)
Renal Navigator understands plan for discharge today and met with patient to review outpatient HD plan. He states his next HD is tomorrow and that he needs to be at his clinic at 10:00am. This is correct. Navigator has informed clinic of discharge today and asked Renal PA to send orders.  Alphonzo Cruise, [place] Renal Navigator [phone]

## 2020-06-29 NOTE — Progress Notes (Signed)
[facility] Progress Note   Subjective:    NAE, [facility] HD yesterday, 1L UF CLIP completed to [facility], MWF   Objective Vitals:   06/28/20 1831 06/28/20 1835 06/28/20 2027 06/29/20 0642  BP: (!) 175/112 (!) 173/105 (!) 155/100 (!) 160/97  Pulse: 88 86 95 90  Resp:   18 18  Temp:  98.8 F (37.1 C) 99.5 F (37.5 C) 98.4 F (36.9 C)  TempSrc:  Oral Oral   SpO2:  98% 100% 100%  Weight:  71 kg 71 kg   Height:       Physical Exam General: comfortable Heart: RRR Lungs:clear Extremities: no edema Neuro: nonfocal Psych:  Normal mood and affect in circumstances Dialysis access:  [facility] with dressing c/d/i; L RC AVF +B/T  Additional Objective Labs: Basic Metabolic Panel: Recent Labs  Lab 06/25/20 0105 06/25/20 0105 06/26/20 0804 06/27/20 0300 06/29/20 0136  NA 140   < > 141 139 138  K 3.8   < > 4.4 4.4 4.2  CL 103   < > 104 100 101  CO2 28   < > $R'25 27 26  'uq$ GLUCOSE 94   < > 110* 82 90  BUN 40*   < > 49* 26* 18  CREATININE 11.35*   < > 13.98* 9.72* 7.46*  CALCIUM 7.1*   < > 7.9* 8.4* 7.8*  PHOS 4.4  --  6.3*  --   --    < > = values in this interval not displayed.   Liver Function Tests: Recent Labs  Lab 06/23/20 0249 06/23/20 0249 06/24/20 0603 06/24/20 0603 06/25/20 0105 06/26/20 0804 06/29/20 0136  AST 13*  --  12*  --   --   --  21  ALT 17  --  13  --   --   --  7  ALKPHOS 31*  --  28*  --   --   --  31*  BILITOT 0.8  --  0.8  --   --   --  0.3  PROT 5.8*  --  5.7*  --   --   --  5.6*  ALBUMIN 3.5   < > 3.5   < > 3.1* 3.3* 3.1*   < > = values in this interval not displayed.   No results for input(s): LIPASE, AMYLASE in the last 168 hours. CBC: Recent Labs  Lab 06/24/20 0603 06/24/20 2218 06/27/20 0300 06/27/20 0300 06/28/20 1500 06/28/20 1500 06/28/20 1835 06/28/20 2001 06/29/20 0136  WBC 4.2   < > 4.6   < > 5.4   < > 6.1 7.4 6.4  NEUTROABS 3.1  --   --   --   --   --   --   --   --   HGB 7.9*   < > 7.5*   < > 7.2*   < > 8.7* 9.1*  8.3*  HCT 23.9*   < > 23.1*   < > 21.5*   < > 26.5* 27.0* 25.3*  MCV 91.2   < > 91.7  --  94.7  --  91.1 90.9 91.7  PLT 84*   < > 46*   < > 80*   < > 66* 74* 80*   < > = values in this interval not displayed.   Blood Culture No results found for: SDES, SPECREQUEST, CULT, REPTSTATUS  Cardiac Enzymes: No results for input(s): CKTOTAL, CKMB, CKMBINDEX, TROPONINI in the last 168 hours. CBG: No results for input(s): GLUCAP in  the last 168 hours. Iron Studies:  No results for input(s): IRON, TIBC, TRANSFERRIN, FERRITIN in the last 72 hours. $RemoveB'@lablastinr3'gxFYwxpU$ @ Studies/Results: CT BONE MARROW BIOPSY & ASPIRATION  Result Date: 06/28/2020 CLINICAL DATA:  Renal failure, thrombocytopenia, leukopenia and anemia. Need for bone marrow biopsy. EXAM: CT GUIDED BONE MARROW ASPIRATION AND BIOPSY ANESTHESIA/SEDATION: Versed 2.0 mg IV, Fentanyl 75 mcg IV Total Moderate Sedation Time:   15 minutes. The patient's level of consciousness and physiologic status were continuously monitored during the procedure by Radiology nursing. PROCEDURE: The procedure risks, benefits, and alternatives were explained to the patient. Questions regarding the procedure were encouraged and answered. The patient understands and consents to the procedure. A time out was performed prior to initiating the procedure. The right gluteal region was prepped with chlorhexidine. Sterile gown and sterile gloves were used for the procedure. Local anesthesia was provided with 1% Lidocaine. Under CT guidance, an 11 gauge On Control bone cutting needle was advanced from a posterior approach into the right iliac bone. Needle positioning was confirmed with CT. Initial non heparinized and heparinized aspirate samples were obtained of bone marrow. Core biopsy was performed via the On Control drill needle. COMPLICATIONS: None FINDINGS: Inspection of initial aspirate did reveal visible particles. Intact core biopsy sample was obtained. IMPRESSION: CT guided bone  marrow biopsy of right posterior iliac bone with both aspirate and core samples obtained. Electronically Signed   By: Aletta Edouard M.D.   On: 06/28/2020 13:03   Medications:  sodium chloride Stopped (06/21/20 0509)   desmopressin (DDAVP) IV for Bleeding      sodium chloride   Intravenous Once   amLODipine  10 mg Oral Daily   B-complex with vitamin C  1 tablet Oral Daily   calcitRIOL  0.25 mcg Oral Daily   calcium carbonate  400 mg of elemental calcium Oral TID WC   Chlorhexidine Gluconate Cloth  6 each Topical Q0600   [START ON 07/01/2020] darbepoetin (ARANESP) injection - DIALYSIS  60 mcg Intravenous Q Sat-HD   feeding supplement (NEPRO CARB STEADY)  237 mL Oral TID BM   loperamide  2 mg Oral Q8H   losartan  50 mg Oral QHS   polyethylene glycol  17 g Oral Daily   vitamin B-12  1,000 mcg Oral Daily    Assessment/Plan: **ESRD:  Serologies all negative. Oncology eval for cytopenias in process.  Has TDC and L RC AVF. CLIP complete.  Cont on MWF, will be MWF at [facility].  No inpatient renal issues, ok for DC today.    **HTN: Elevated on amlodipine. Probe down post weights, add losartan $RemoveBefo'50mg'BMKCQMdfyAH$  qhs.  **Pancytopenia:  Anemia expected with CKD but not pancytopenia.  Iron ok, started ESA. HIV neg.  Heme following s/p BM Bx 10/13.  No heparin with HD for now.   **Metabolic acidosis: resolved  **N/V/diarrhea:  Suspect mostly uremia; Gi pathogen panel neg.. resolved  **secondary hyperPTH: concomitant hypoCa.  PTH 479.  calcitriol 0.25 daily.  On ca carbonate TIDAC for binder --> would change to nonca based binder long term but for now in context of hypoCa cont.   Rexene Agent  06/29/2020, 10:46 AM  [employer]

## 2020-06-29 NOTE — Plan of Care (Signed)
  Problem: Clinical Measurements: Goal: Ability to maintain clinical measurements within normal limits will improve Outcome: Adequate for Discharge Goal: Will remain free from infection Outcome: Adequate for Discharge   Problem: Coping: Goal: Level of anxiety will decrease Outcome: Adequate for Discharge   Problem: Education: Goal: Knowledge of disease and its progression will improve Outcome: Adequate for Discharge   Problem: Self-Concept: Goal: Body image disturbance will be avoided or minimized Outcome: Adequate for Discharge

## 2020-06-30 LAB — METHYLMALONIC ACID, SERUM: Methylmalonic Acid, Quantitative: 452 nmol/L — ABNORMAL HIGH (ref 0–378)

## 2020-06-30 LAB — SURGICAL PATHOLOGY

## 2020-07-01 ENCOUNTER — Telehealth: Payer: Self-pay | Admitting: Nephrology

## 2020-07-01 NOTE — Telephone Encounter (Signed)
Transition of care contact from inpatient facility  Date of discharge: 06/29/20 Date of contact: 07/01/20 Method: Phone Spoke to: Patient  Patient contacted to discuss transition of care from recent inpatient hospitalization. Patient was admitted to [facility] from .10/05021 to 19/14/21 .. with discharge diagnosis of .Marland KitchenSevere Aki/uremia/Thrombocytopenia/Pancytopenia  Medication changes were reviewed.Told to take Amlodipine  At hs not am/ was not given calcitriol will give at center  Patient will follow up with his/her outpatient HD unit on:

## 2020-07-02 LAB — COPPER, SERUM: Copper: 60 ug/dL — ABNORMAL LOW (ref 63–121)

## 2020-07-03 LAB — UPEP/UIFE/LIGHT CHAINS/TP, 24-HR UR
% BETA, Urine: 13.4 %
ALPHA 1 URINE: 6 %
Albumin, U: 59.1 %
Alpha 2, Urine: 9.6 %
Free Kappa Lt Chains,Ur: 176.5 mg/L — ABNORMAL HIGH (ref 0.63–113.79)
Free Kappa/Lambda Ratio: 4.79 (ref 1.03–31.76)
Free Lambda Lt Chains,Ur: 36.85 mg/L — ABNORMAL HIGH (ref 0.47–11.77)
GAMMA GLOBULIN URINE: 11.9 %
Total Protein, Urine-Ur/day: 1630 mg/24 hr — ABNORMAL HIGH (ref 30–150)
Total Protein, Urine: 171.6 mg/dL
Total Volume: 950

## 2020-07-05 ENCOUNTER — Encounter (HOSPITAL_COMMUNITY): Payer: Self-pay | Admitting: Hematology

## 2020-07-17 ENCOUNTER — Telehealth: Payer: Self-pay | Admitting: Hematology

## 2020-07-17 NOTE — Telephone Encounter (Signed)
-  Called patient and discussed his bone marrow biopsy results in details.  No evidence of overt primary bone marrow pathology.  No leukemia no lymphoma no myeloproliferative neoplasms.  Mildly hypocellular bone marrow likely due to his severe renal disease on presentation.  Cytogenetics with no overt mutations.  Continue follow-up with nephrology with dialysis and continued attention to good blood pressure control. Reconsult hematology if any other new questions or concerns arise.   Surgical Pathology  CASE: [id number]  PATIENT: Brian Duffy  Bone Marrow Report      Clinical History: thrombocytopenia, leukopenia, anemia , AKI/ESRD      DIAGNOSIS:   BONE MARROW, ASPIRATE, CLOT, CORE:  -Hypocellular bone marrow for age with trilineage hematopoiesis  -See comment   PERIPHERAL BLOOD:  -Normocytic-normochromic anemia  -Thrombocytopenia   COMMENT:   The bone marrow is hypocellular for age but with trilineage  hematopoiesis including relative abundance of megakaryocytes. The  findings are not considered specific and possibly related to the  patient's known renal disease. Correlation with cytogenetic studies is  recommended

## 2020-07-20 ENCOUNTER — Inpatient Hospital Stay: Payer: Self-pay | Admitting: Family Medicine

## 2020-07-25 ENCOUNTER — Other Ambulatory Visit: Payer: Self-pay

## 2020-07-25 DIAGNOSIS — N179 Acute kidney failure, unspecified: Secondary | ICD-10-CM

## 2020-08-01 ENCOUNTER — Ambulatory Visit (INDEPENDENT_AMBULATORY_CARE_PROVIDER_SITE_OTHER): Payer: Self-pay | Admitting: Physician Assistant

## 2020-08-01 ENCOUNTER — Ambulatory Visit (HOSPITAL_COMMUNITY)
Admission: RE | Admit: 2020-08-01 | Discharge: 2020-08-01 | Disposition: A | Discharge status: Home / Self Care | Payer: Medicaid Other | Source: Ambulatory Visit | Attending: Physician Assistant | Admitting: Physician Assistant

## 2020-08-01 ENCOUNTER — Other Ambulatory Visit: Payer: Self-pay

## 2020-08-01 VITALS — BP 127/85 | HR 78 | Temp 97.9°F | Resp 20 | Ht 74.0 in | Wt 159.3 lb

## 2020-08-01 DIAGNOSIS — N179 Acute kidney failure, unspecified: Secondary | ICD-10-CM | POA: Insufficient documentation

## 2020-08-01 DIAGNOSIS — Z992 Dependence on renal dialysis: Secondary | ICD-10-CM

## 2020-08-01 DIAGNOSIS — N186 End stage renal disease: Secondary | ICD-10-CM

## 2020-08-01 NOTE — Progress Notes (Signed)
  POST OPERATIVE DIALYSIS ACCESS OFFICE NOTE    CC:  F/u for dialysis access surgery  HPI:  This is a 29 y.o. male who is s/p left radiocephalic fistula June 27, 2020 by Dr. Donnetta Hutching. He denies hand pain, fever or chills.  He is currently dialyzing via right IJ hemodialysis catheter.   Dialysis days:  MWF   Dialysis center:  [facility]  No Known Allergies  Current Outpatient Medications  Medication Sig Dispense Refill  . calcitRIOL (ROCALTROL) 0.25 MCG capsule Take 1 capsule (0.25 mcg total) by mouth daily. 30 capsule 0  . vitamin B-12 1000 MCG tablet Take 1 tablet (1,000 mcg total) by mouth daily. 30 tablet 0  . amLODipine (NORVASC) 10 MG tablet Take 1 tablet (10 mg total) by mouth daily. (Patient not taking: Reported on 08/01/2020) 30 tablet 1   No current facility-administered medications for this visit.     ROS:  See HPI  Vitals:   08/01/20 1444  BP: 127/85  Pulse: 78  Resp: 20  Temp: 97.9 F (36.6 C)  SpO2: 98%    Physical Exam:  General appearance: Well-developed well-nourished in no apparent distress Cardiac: Rate and rhythm are regular Respiratory: Nonlabored Incision: Healing without signs of infection Extremities: Left upper extremity: Left hand is warm and well-perfused.  5 out of 5 hand grip strength.  Good bruit and thrill in fistula.  It is palpable along its course.  Dialysis duplex on 08/01/2020 Average depth is 0.16 cm.  Diameter range 0.47 to 0.68 cm.  Assessment/Plan:   -pt does not have evidence of steal syndrome -dialysis duplex today reveals fistula to be of adequate diameter and depth for access -the fistula/graft may be used starting September 18, 2020 -Once the patient's fistula is adequately accessed, will defer to nephrology team to make arrangements with interventional radiology for catheter removal.  Barbie Banner, PA-C 08/01/2020 2:45 PM [facility] [phone]  Clinic MD:  Stanford Breed

## 2020-08-18 ENCOUNTER — Inpatient Hospital Stay: Payer: Self-pay | Admitting: Family Medicine

## 2020-08-30 ENCOUNTER — Inpatient Hospital Stay: Payer: Self-pay | Admitting: Physician Assistant

## 2020-09-27 NOTE — Progress Notes (Signed)
Patient ID: Brian Mollett., male   DOB: [birth year], 30 y.o.   MRN: [id number]     Brian Duffy, is a 30 y.o. male  GOT:[id number]  DHR:[id number]  DOB - 1991-07-12  Subjective:  Chief Complaint and HPI: Brian Duffy is a 30 y.o. male here today to establish care and for a follow up visit After hospitalization 06/20/2020-06/29/2020 with severe anemia and diagnosed with ESRD and A-V fistula created and dialysis started.  From phone notes of Hematologist, he was not found to have (per phone note with Dr Robin Searing patient and discussed his bone marrow biopsy results in details.  No evidence of overt primary bone marrow pathology.  No leukemia no lymphoma no myeloproliferative neoplasms.  Mildly hypocellular bone marrow likely due to his severe renal disease on presentation. Cytogenetics with no overt mutations. Continue follow-up with nephrology with dialysis and continued attention to good blood pressure control. Reconsult hematology if any other new questions or concerns arise.) Today he presents with A-V fistula in L forearm and is going to dialysis M,W,F.  Appetite has improved.  No longer taking vitamin B12.  Not taking amlodipine and BP has been fine.  He hasn't been taking amlodipine for more than a month and has been fine on dialysis visits.  Cause of ESRD is unknown.     From hospital discharge summary: History of present illness:  30 year old male with no past medical history other than severe loss of appetiteandnausea came to emergency room, found to have severe azotemia/creatinine 130/24 CO2 13,and pancytopeniaHemoglobin 5.6 WBC 3 platelet 111  Hospital Course:   Severe AKI/new ESRD Metabolic acidosis -Admitted with severe uremia, creatinine of 24 and BUN of 130 -Followed by nephrology this admission  -renal ultrasound noted diffuse echogenicity, small atrophic kidneys on admission -Serologies for AKI were negative (including GBM antibody, C4, C3 was mildly low, ANA,  PNC ANCA were negative, hepatitis B surface antigen negative, HIV negative) -Etiology remains unclear, now declared ESRD -Had a tunneled dialysis catheter placed and left arm AV fistula -Clip for outpatient dialysis complete -Be discharged home today in a stable condition, patient has been educated about renal diet and importance of compliance with dialysis, next HD at his dialysis center tomorrow Friday 10/15  Pancytopenia Anemia of chronic disease -Admitted with a hemoglobin of 5.6, white count of 3 and platelet count of 110, no prior labs ever done per patient report -anemia panel with adequate iron stores, consistent with chronic disease however kidney disease would not explain his leukopenia and thrombocytopenia -Heme-onc consulted, HIT antibody was negative, B12 was slightly low at 277, methylmalonic acid level and copper level are pending, started on B12 replacement -Parvovirus IgG positive, IgM negative, haptoglobin was normal, LDH was only minimally elevated, myeloma panel was unremarkable -Underwent bone marrow aspiration biopsy yesterday -Transfused 3 units of PRBC this admission -Patient is adamant to discharge home ASAP and not willing to stay until definitive diagnosis is clear, he reiterates that he will follow-up with hematology next week -I sent a message to hematology yesterday, they will arrange a quick follow-up with labs with Dr. Irene Limbo at the cancer center  -Continue EPO weekly with dialysis   Secondary hyperparathyroidism -Continue calcitriol per renal  Hypertension -Continue amlodipine  Recommendations for Outpatient Follow-up:  1. Hematology Dr.Kale at [facility] in 2 weeks, to follow-up bone marrow biopsy 2. Please check CBC in 1 week 3. Outpatient hemodialysis Monday Wednesday Friday 4. Vascular surgery Dr. Donnetta Hutching in 4 weeks   Discharge Diagnoses:  Principal Problem: Severe AKI (acute kidney injury) ([place])   Uremia   Metabolic acidosis    Normocytic anemia   Thrombocytopenia (H[facility])   Protein-calorie malnutrition, severe   Pancytopenia ([place])   ED/Hospital notes reviewed.     ROS:   Constitutional:  No f/c, No night sweats, No unexplained weight loss. EENT:  No vision changes, No blurry vision, No hearing changes. No mouth, throat, or ear problems.  Respiratory: No cough, No SOB Cardiac: No CP, no palpitations GI:  No abd pain, No N/V/D. GU: No Urinary s/sx Musculoskeletal: No joint pain Neuro: No headache, no dizziness, no motor weakness.  Skin: No rash Endocrine:  No polydipsia. No polyuria.  Psych: Denies SI/HI  No problems updated.  ALLERGIES: No Known Allergies  PAST MEDICAL HISTORY: History reviewed. No pertinent past medical history.  MEDICATIONS AT HOME: Prior to Admission medications   Medication Sig Start Date End Date Taking? Authorizing Provider  calcitRIOL (ROCALTROL) 0.25 MCG capsule Take 1 capsule (0.25 mcg total) by mouth daily. 06/30/20  Yes Domenic Polite, MD  vitamin B-12 1000 MCG tablet Take 1 tablet (1,000 mcg total) by mouth daily. Patient not taking: Reported on 09/28/2020 06/30/20   Domenic Polite, MD     Objective:  EXAM:   Vitals:   09/28/20 1128  BP: 121/76  Pulse: 75  Temp: 98.4 F (36.9 C)  TempSrc: Oral  SpO2: 100%  Weight: 159 lb (72.1 kg)  Height: 6' 1.23" (1.86 m)    General appearance : A&OX3. NAD. Non-toxic-appearing HEENT: Atraumatic and Normocephalic.  PERRLA. EOM intact.  Chest/Lungs:  Breathing-non-labored, Good air entry bilaterally, breath sounds normal without rales, rhonchi, or wheezing  CVS: S1 S2 regular, no murmurs, gallops, rubs  Extremities: Bilateral Lower Ext shows no edema, both legs are warm to touch with = pulse throughout Neurology:  CN II-XII grossly intact, Non focal.   Psych:  TP linear. J/I WNL. Normal speech. Appropriate eye contact and affect.  Skin:  No Rash  Data Review No results found for: HGBA1C   Assessment & Plan   1.  Pancytopenia ([place]) - CBC with Differential/Platelet  2. AKI (acute kidney injury) ([place]) Progressed quickly to ESRD  3. ESRD (end stage renal disease) ([place]) On dialysis M,W,F and [facility] - Comprehensive metabolic panel - Thyroid Panel With TSH  4. Uremia On dialysis - Comprehensive metabolic panel - Thyroid Panel With TSH  5. Hospital discharge follow-up Doing well  6. Low serum vitamin B12 Will resume B12 if needed - Vitamin B12  7. Poor appetite Improving since hospitalization - Thyroid Panel With TSH   Patient have been counseled extensively about nutrition and exercise  Return in about 3 months (around 12/27/2020) for assign PCP.  The patient was given clear instructions to go to ER or return to medical center if symptoms don't improve, worsen or new problems develop. The patient verbalized understanding. The patient was told to call to get lab results if they haven't heard anything in the next week.     Freeman Caldron, PA-C [facility], [place]   09/28/2020, 11:54 AM

## 2020-09-28 ENCOUNTER — Ambulatory Visit: Payer: MEDICAID | Attending: Physician Assistant | Admitting: Physician Assistant

## 2020-09-28 ENCOUNTER — Encounter: Payer: Self-pay | Admitting: Physician Assistant

## 2020-09-28 ENCOUNTER — Other Ambulatory Visit: Payer: Self-pay

## 2020-09-28 VITALS — BP 121/76 | HR 75 | Temp 98.4°F | Ht 73.23 in | Wt 159.0 lb

## 2020-09-28 DIAGNOSIS — N186 End stage renal disease: Secondary | ICD-10-CM

## 2020-09-28 DIAGNOSIS — R630 Anorexia: Secondary | ICD-10-CM

## 2020-09-28 DIAGNOSIS — Z09 Encounter for follow-up examination after completed treatment for conditions other than malignant neoplasm: Secondary | ICD-10-CM

## 2020-09-28 DIAGNOSIS — E538 Deficiency of other specified B group vitamins: Secondary | ICD-10-CM

## 2020-09-28 DIAGNOSIS — N179 Acute kidney failure, unspecified: Secondary | ICD-10-CM

## 2020-09-28 DIAGNOSIS — N19 Unspecified kidney failure: Secondary | ICD-10-CM

## 2020-09-28 DIAGNOSIS — D61818 Other pancytopenia: Secondary | ICD-10-CM

## 2020-09-29 LAB — COMPREHENSIVE METABOLIC PANEL
ALT: 7 IU/L (ref 0–44)
AST: 9 IU/L (ref 0–40)
Albumin/Globulin Ratio: 1.9 (ref 1.2–2.2)
Albumin: 4.3 g/dL (ref 4.1–5.2)
Alkaline Phosphatase: 70 IU/L (ref 44–121)
BUN/Creatinine Ratio: 2 — ABNORMAL LOW (ref 9–20)
BUN: 22 mg/dL — ABNORMAL HIGH (ref 6–20)
Bilirubin Total: 0.2 mg/dL (ref 0.0–1.2)
CO2: 31 mmol/L — ABNORMAL HIGH (ref 20–29)
Calcium: 9.6 mg/dL (ref 8.7–10.2)
Chloride: 97 mmol/L (ref 96–106)
Creatinine, Ser: 9.97 mg/dL — ABNORMAL HIGH (ref 0.76–1.27)
GFR calc Af Amer: 7 mL/min/{1.73_m2} — ABNORMAL LOW (ref >59)
GFR calc non Af Amer: 6 mL/min/{1.73_m2} — ABNORMAL LOW (ref >59)
Globulin, Total: 2.3 g/dL (ref 1.5–4.5)
Glucose: 79 mg/dL (ref 65–99)
Potassium: 4.4 mmol/L (ref 3.5–5.2)
Sodium: 144 mmol/L (ref 134–144)
Total Protein: 6.6 g/dL (ref 6.0–8.5)

## 2020-09-29 LAB — CBC WITH DIFFERENTIAL/PLATELET
Basophils Absolute: 0 10*3/uL (ref 0.0–0.2)
Basos: 1 %
EOS (ABSOLUTE): 0.6 10*3/uL — ABNORMAL HIGH (ref 0.0–0.4)
Eos: 18 %
Hematocrit: 35.7 % — ABNORMAL LOW (ref 37.5–51.0)
Hemoglobin: 12.1 g/dL — ABNORMAL LOW (ref 13.0–17.7)
Immature Grans (Abs): 0 10*3/uL (ref 0.0–0.1)
Immature Granulocytes: 0 %
Lymphocytes Absolute: 1.3 10*3/uL (ref 0.7–3.1)
Lymphs: 42 %
MCH: 32.8 pg (ref 26.6–33.0)
MCHC: 33.9 g/dL (ref 31.5–35.7)
MCV: 97 fL (ref 79–97)
Monocytes Absolute: 0.3 10*3/uL (ref 0.1–0.9)
Monocytes: 8 %
Neutrophils Absolute: 1 10*3/uL — ABNORMAL LOW (ref 1.4–7.0)
Neutrophils: 31 %
Platelets: 118 10*3/uL — ABNORMAL LOW (ref 150–450)
RBC: 3.69 x10E6/uL — ABNORMAL LOW (ref 4.14–5.80)
RDW: 13.9 % (ref 11.6–15.4)
WBC: 3.2 10*3/uL — ABNORMAL LOW (ref 3.4–10.8)

## 2020-09-29 LAB — THYROID PANEL WITH TSH
Free Thyroxine Index: 2 (ref 1.2–4.9)
T3 Uptake Ratio: 31 % (ref 24–39)
T4, Total: 6.3 ug/dL (ref 4.5–12.0)
TSH: 1.47 u[IU]/mL (ref 0.450–4.500)

## 2020-09-29 LAB — VITAMIN B12: Vitamin B-12: 436 pg/mL (ref 232–1245)

## 2020-10-04 ENCOUNTER — Telehealth (INDEPENDENT_AMBULATORY_CARE_PROVIDER_SITE_OTHER): Payer: Self-pay

## 2020-10-04 NOTE — Telephone Encounter (Signed)
-----   Message from [place] Donovan, [place] sent at 10/04/2020  3:23 PM EST ----- Please call patient.  Kidney function is impaired (he is on dialysis).  Platelets are low but improving.  Avoid products with aspirin or anti-inflammatories and avoid alcohol.  Follow up as planned.  Thanks, Freeman Caldron, PA-C

## 2020-10-04 NOTE — Telephone Encounter (Signed)
Spoke with patient. Verified date of birth. He is aware that kidney function is impaired( he is on dialysis). Platelets low; advised to avoid alcohol, aspirin and anti inflammatories. He verbalized understanding. Nat Christen, CMA

## 2021-04-16 IMAGING — US US RENAL
1 series · 14 of 25 positions shown · non-contrast
Comparison: None.

CLINICAL DATA: Acute kidney injury

EXAM:
RENAL / URINARY TRACT ULTRASOUND COMPLETE

[Series 1: us renal · 14 of 37 slices shown]
[im 1/37]
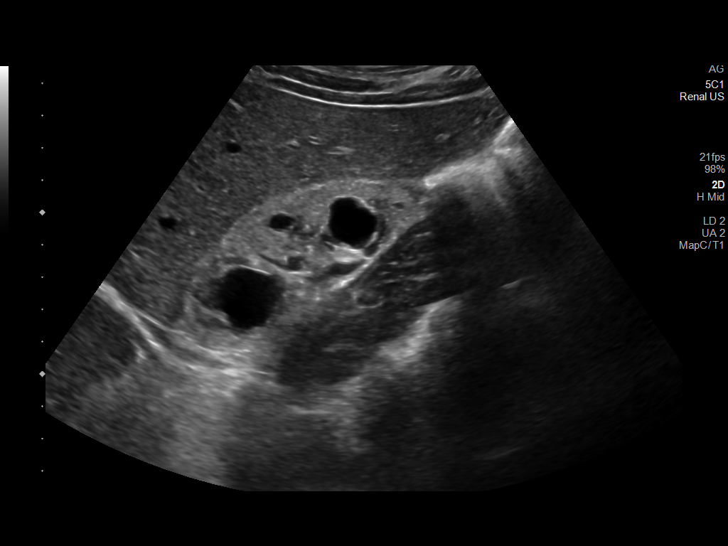
[im 4/37]
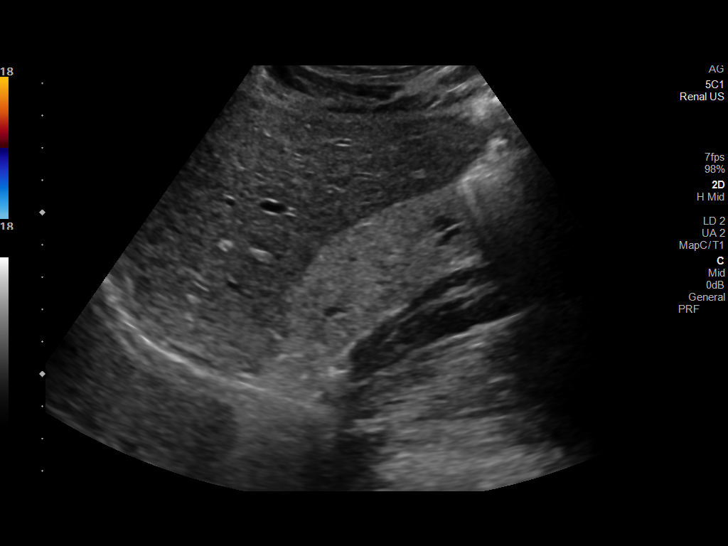
[im 7/37]
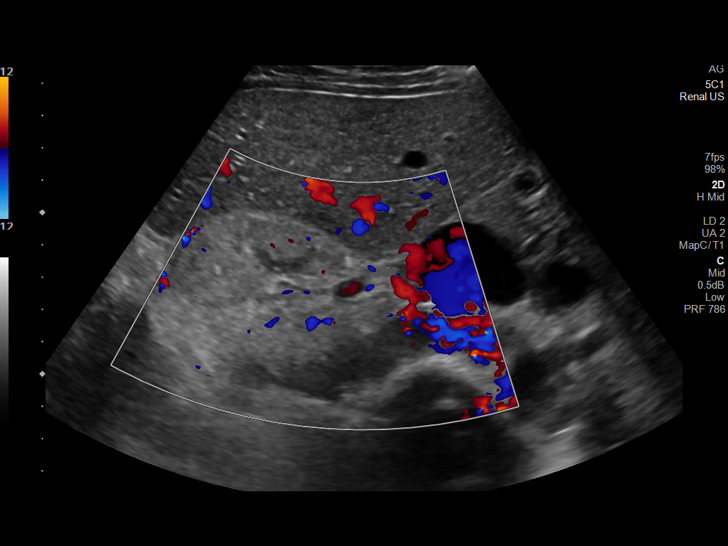
[im 10/37]
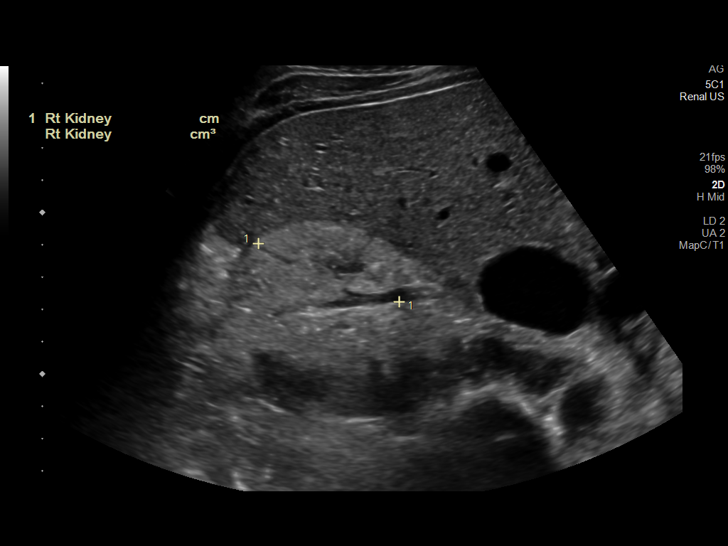
[im 13/37]
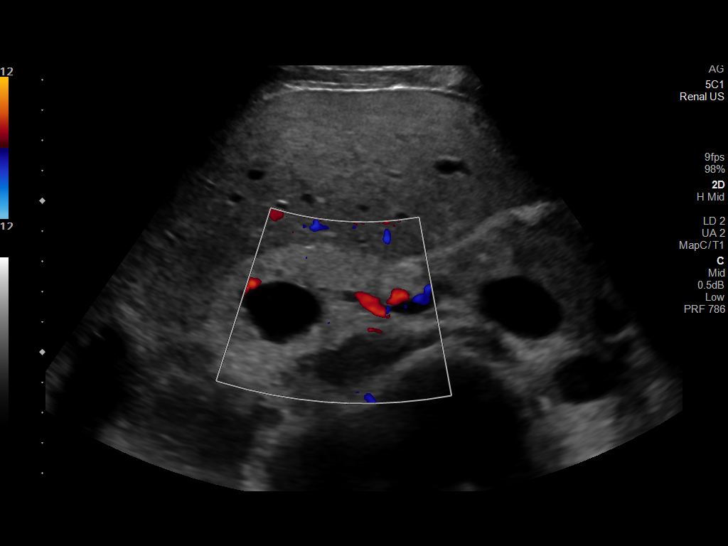
[im 14/37]
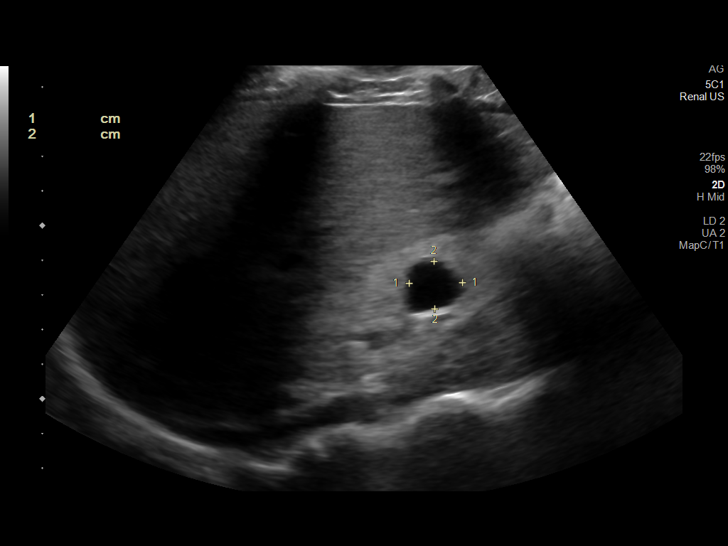
[im 17/37]
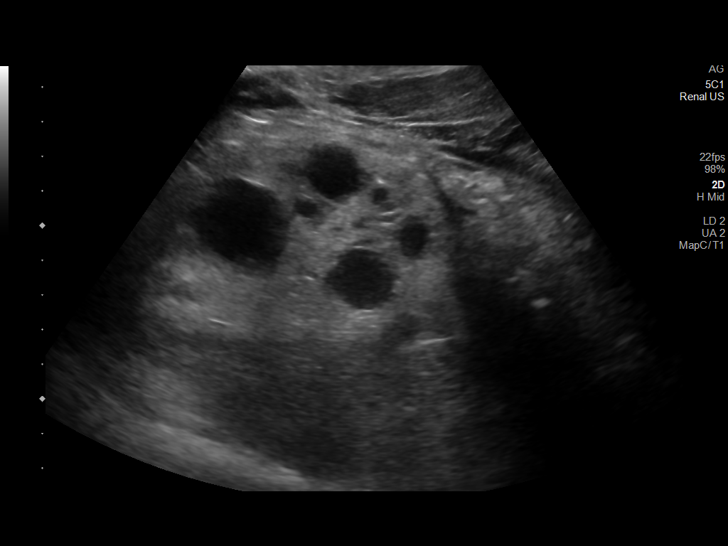
[im 20/37]
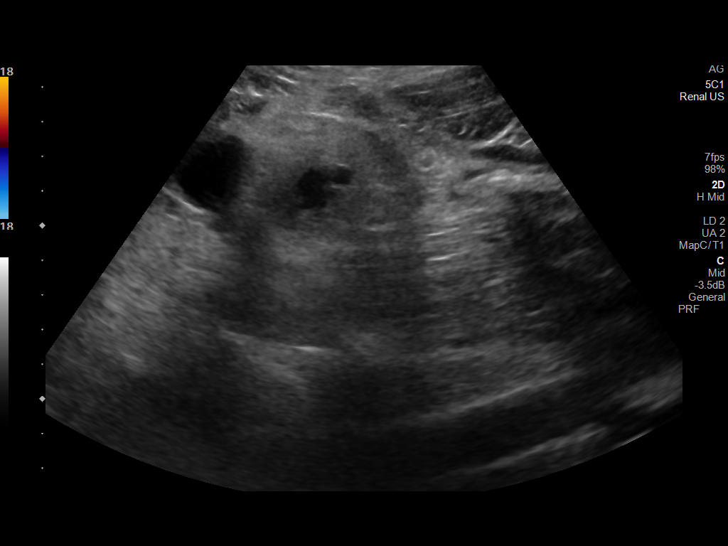
[im 23/37]
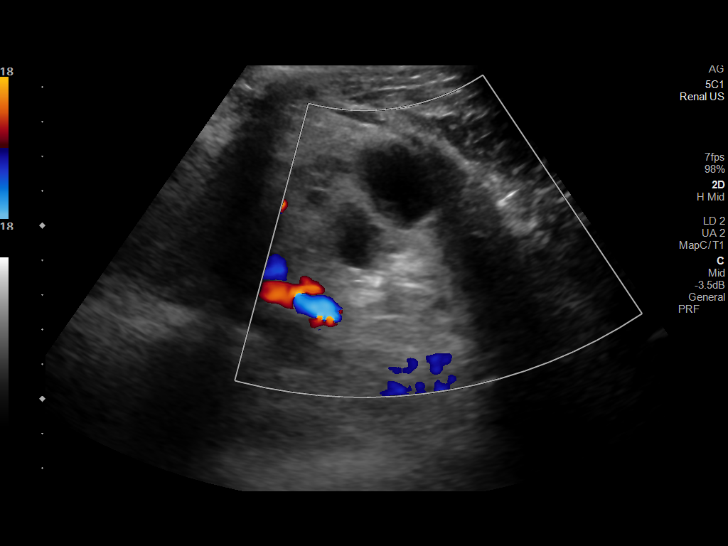
[im 25/37]
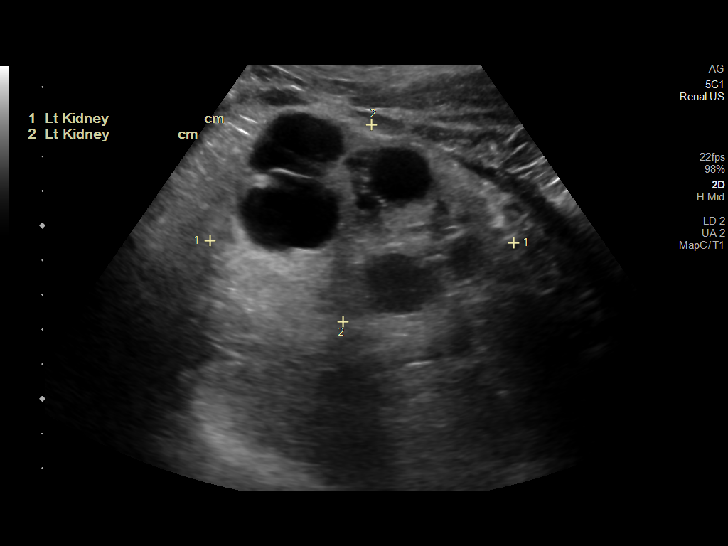
[im 28/37]
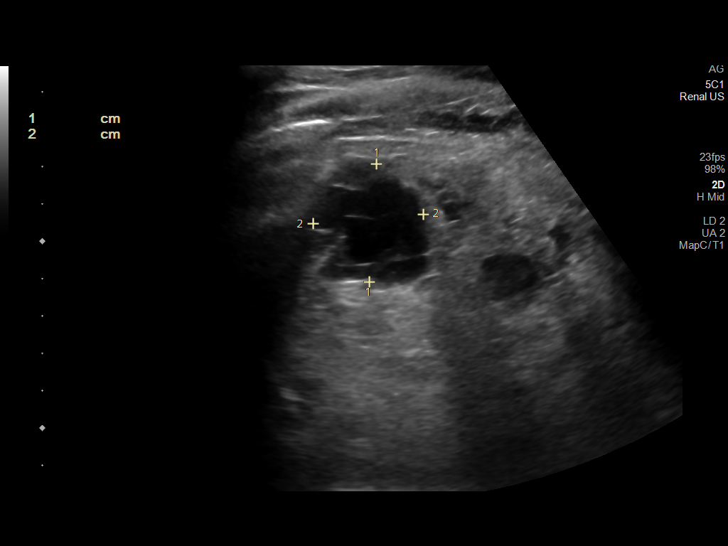
[im 31/37]
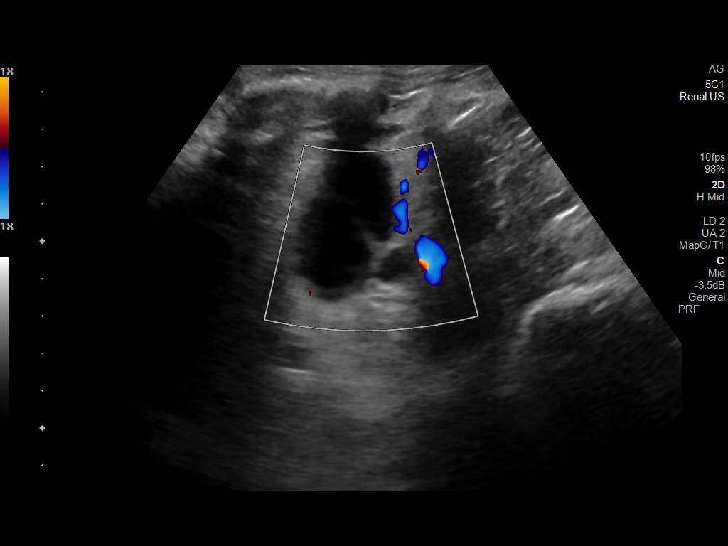
[im 34/37]
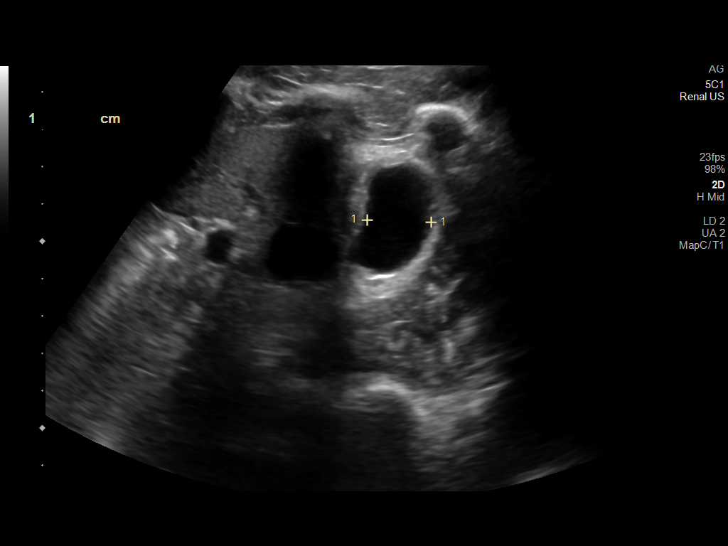
[im 37/37]
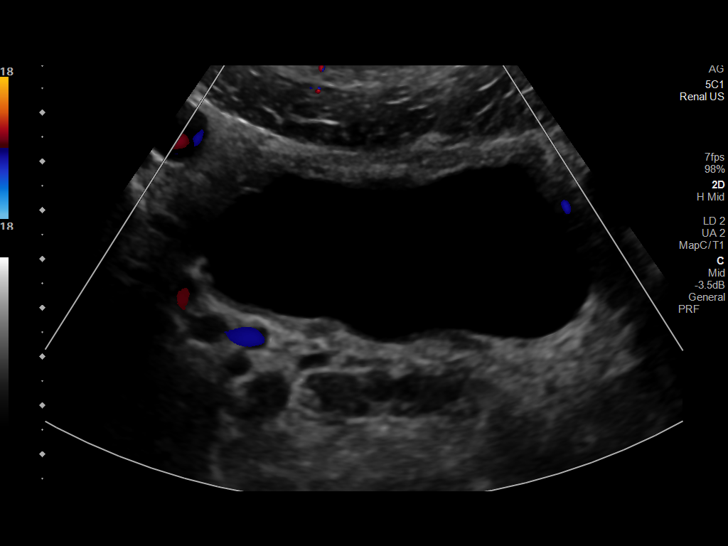

[14 of 25 positions shown; findings below may reference images not displayed]

FINDINGS: Right Kidney:

Renal measurements: 8.7 x 3.6 x 4.7 cm = volume: 77 mL. There is
diffusely increased echogenicity with atrophic appearance. Several
anechoic cysts are seen within the right kidney the largest
measuring 2.8 x 2.1 x 2.6 cm.

Left Kidney:

Renal measurements: 8.8 x 5.7 x 5.3 cm = volume: 140 mL. Diffusely
increased echogenicity with a slightly atrophic appearance. Several
anechoic cysts are seen within the left kidney measuring 3.2 x 2.0 x
2.8 cm

Bladder:

Appears normal for degree of bladder distention.

Other:

None.
IMPRESSION: Diffusely increased parenchymal echogenicity with several anechoic
cyst. This could be due to medical renal disease with possible
polycystic kidneys.

## 2021-04-23 IMAGING — CT CT BIOPSY AND ASPIRATION BONE MARROW
1 of 2 series · 15 of 32 positions shown, 19 images · non-contrast
Comparison: none

CLINICAL DATA: Renal failure, thrombocytopenia, leukopenia and
anemia. Need for bone marrow biopsy.

[Series 2: i-spiral 5.0 b40f · axial · 0.86mm/px · z∈[+828,+1045]mm · 15 of 70 slices shown, 19 images]
[im 5/70  soft-tissue]
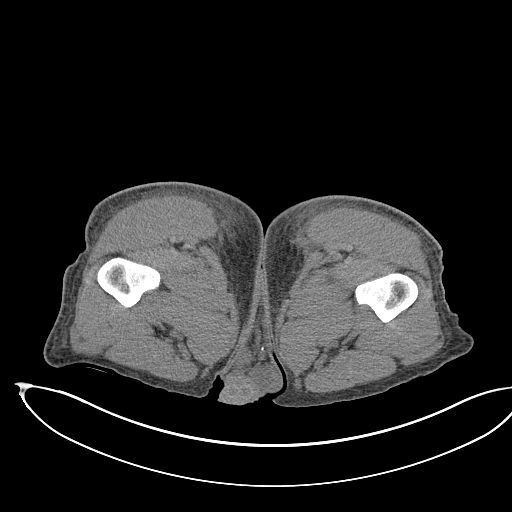
[im 5/70  bone]
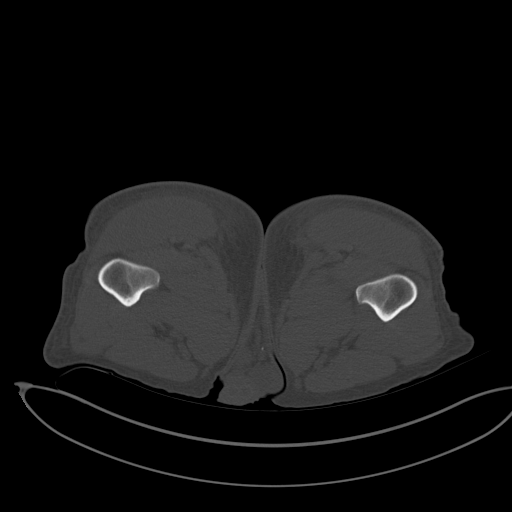
[im 10/70  soft-tissue]
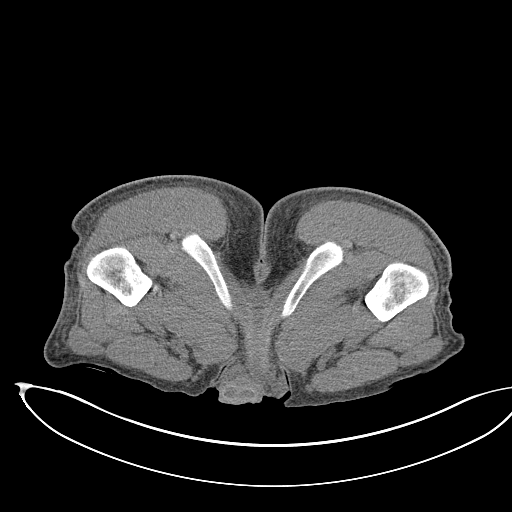
[im 15/70  soft-tissue]
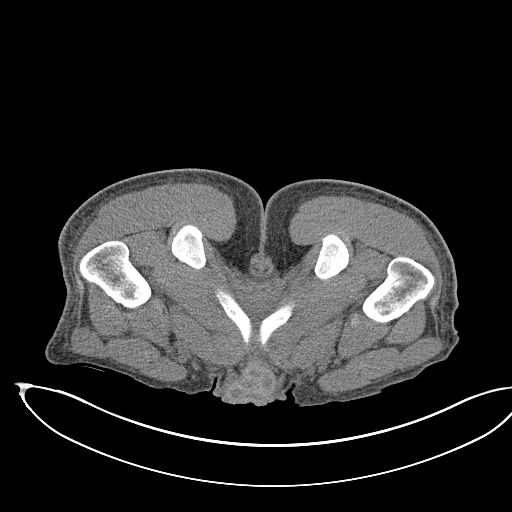
[im 20/70  soft-tissue]
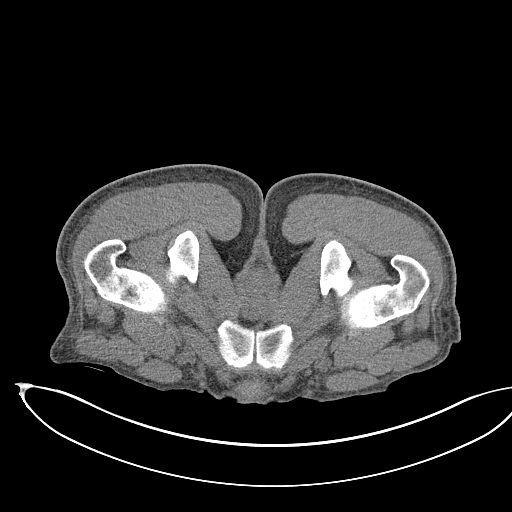
[im 25/70  soft-tissue]
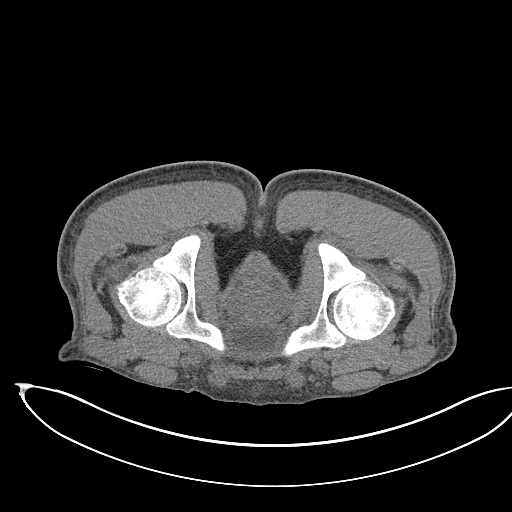
[im 30/70  soft-tissue]
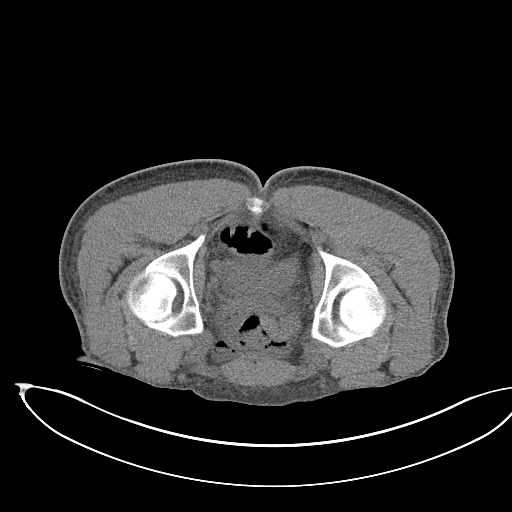
[im 35/70  soft-tissue]
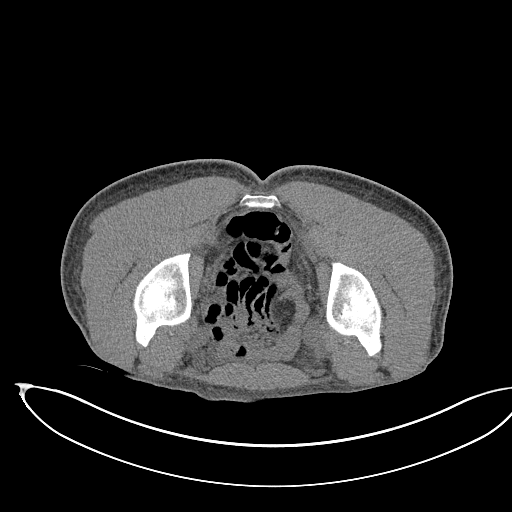
[im 40/70  soft-tissue]
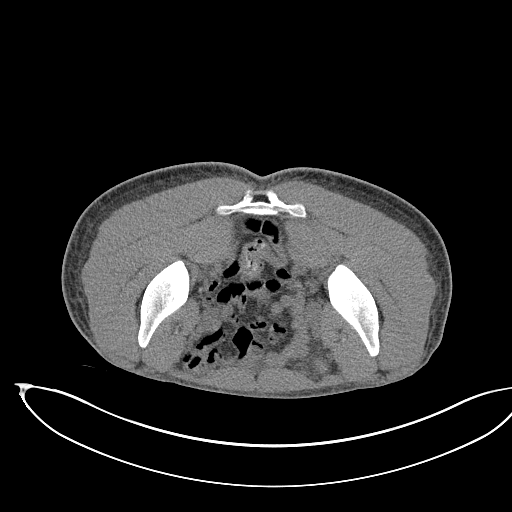
[im 45/70  soft-tissue]
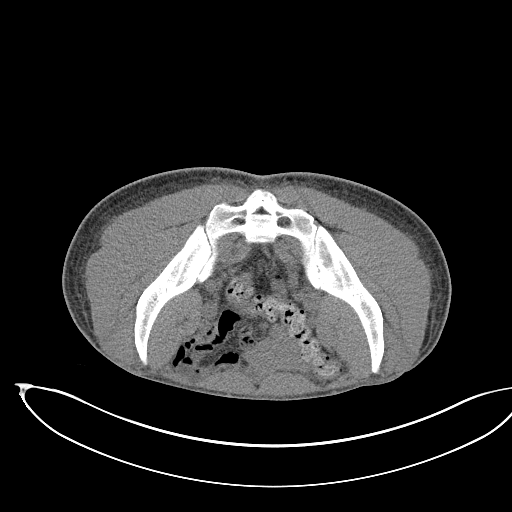
[im 45/70  bone]
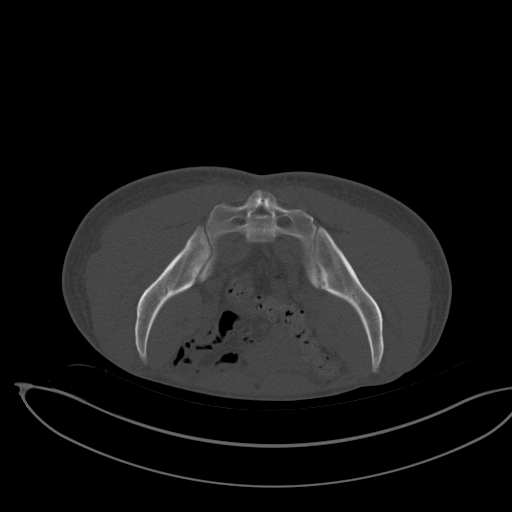
[im 50/70  soft-tissue]
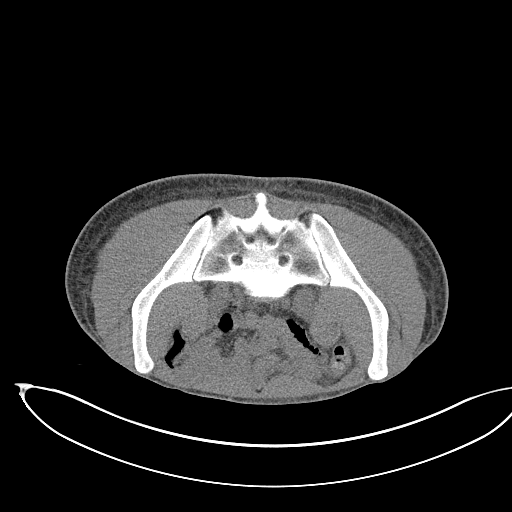
[im 55/70  soft-tissue]
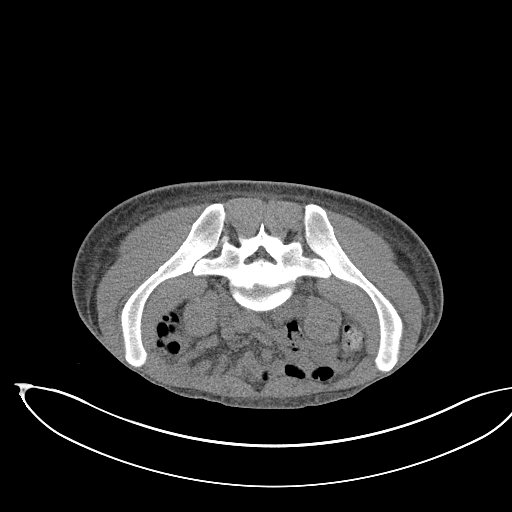
[im 60/70  soft-tissue]
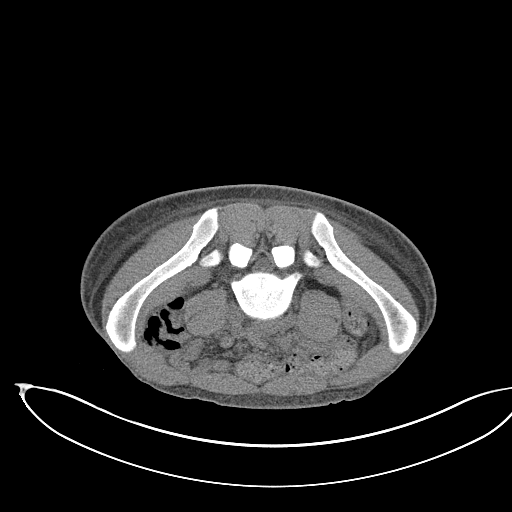
[im 60/70  lung]
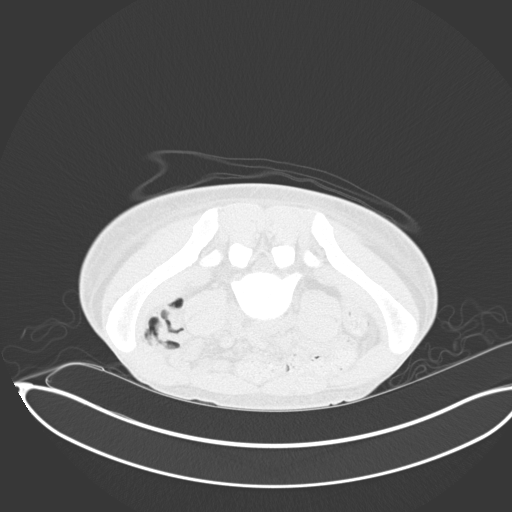
[im 62/70  lung]
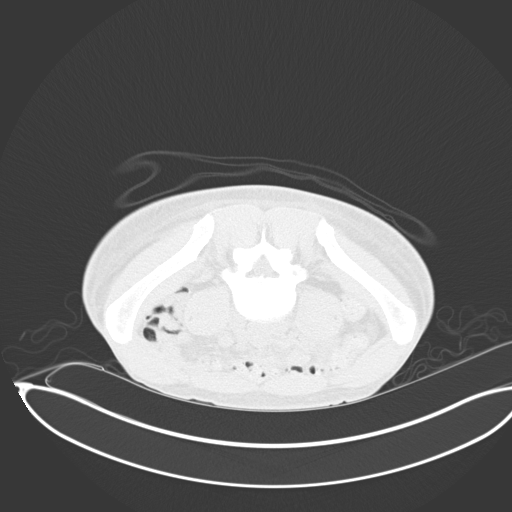
[im 65/70  soft-tissue]
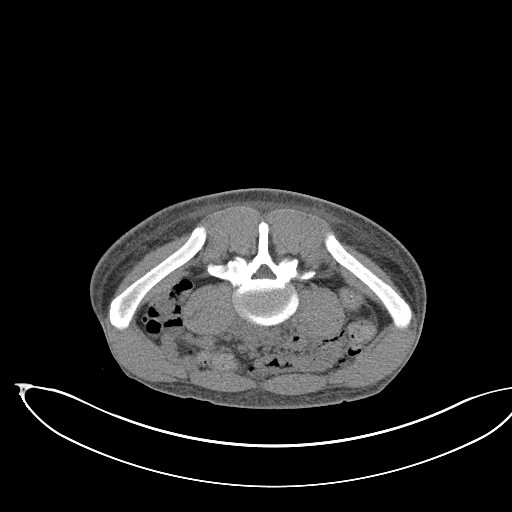
[im 65/70  lung]
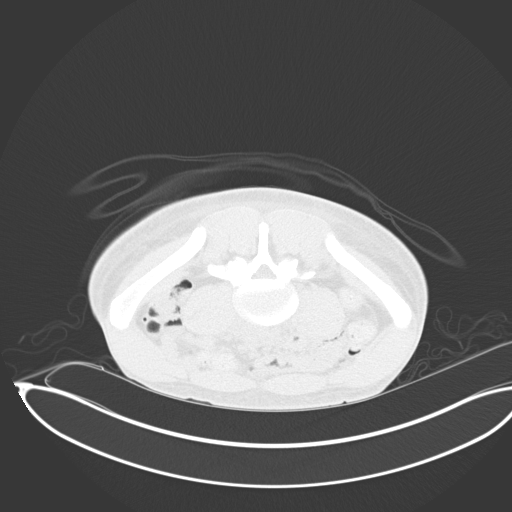
[im 67/70  lung]
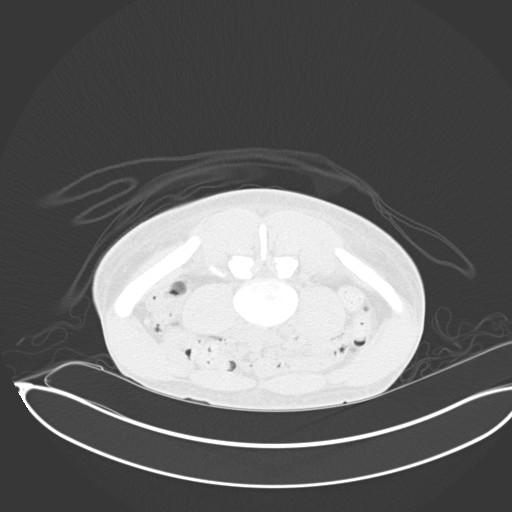

[15 of 32 positions shown; findings below may reference images not displayed]

EXAM:
CT GUIDED BONE MARROW ASPIRATION AND BIOPSY

ANESTHESIA/SEDATION:
Versed 2.0 mg IV, Fentanyl 75 mcg IV

Total Moderate Sedation Time:   15 minutes.

The patient's level of consciousness and physiologic status were
continuously monitored during the procedure by Radiology nursing.

PROCEDURE:
The procedure risks, benefits, and alternatives were explained to
the patient. Questions regarding the procedure were encouraged and
answered. The patient understands and consents to the procedure. A
time out was performed prior to initiating the procedure.

The right gluteal region was prepped with chlorhexidine. Sterile
gown and sterile gloves were used for the procedure. Local
anesthesia was provided with 1% Lidocaine.

Under CT guidance, an 11 gauge On Control bone cutting needle was
advanced from a posterior approach into the right iliac bone. Needle
positioning was confirmed with CT. Initial non heparinized and
heparinized aspirate samples were obtained of bone marrow. Core
biopsy was performed via the On Control drill needle.

COMPLICATIONS:
None
FINDINGS: Inspection of initial aspirate did reveal visible particles. Intact
core biopsy sample was obtained.
IMPRESSION: CT guided bone marrow biopsy of right posterior iliac bone with both
aspirate and core samples obtained.
